# Patient Record
Sex: Female | Born: 1995 | Race: White | Hispanic: No | Marital: Single | Smoking: Never smoker
Health system: Southern US, Community
[De-identification: ages and names within clinical notes are randomized; demographics above are authoritative.]

---
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
## 2017-03-03 ENCOUNTER — Ambulatory Visit (INDEPENDENT_AMBULATORY_CARE_PROVIDER_SITE_OTHER): Payer: Federal, State, Local not specified - PPO | Admitting: Women's Health

## 2017-03-03 ENCOUNTER — Encounter: Payer: Self-pay | Admitting: Women's Health

## 2017-03-03 VITALS — BP 112/78 | Ht 66.0 in | Wt 136.0 lb

## 2017-03-03 DIAGNOSIS — Z113 Encounter for screening for infections with a predominantly sexual mode of transmission: Secondary | ICD-10-CM | POA: Diagnosis not present

## 2017-03-03 DIAGNOSIS — Z01419 Encounter for gynecological examination (general) (routine) without abnormal findings: Secondary | ICD-10-CM

## 2017-03-03 DIAGNOSIS — Z23 Encounter for immunization: Secondary | ICD-10-CM | POA: Diagnosis not present

## 2017-03-03 DIAGNOSIS — N760 Acute vaginitis: Secondary | ICD-10-CM | POA: Diagnosis not present

## 2017-03-03 DIAGNOSIS — N898 Other specified noninflammatory disorders of vagina: Secondary | ICD-10-CM | POA: Diagnosis not present

## 2017-03-03 DIAGNOSIS — B373 Candidiasis of vulva and vagina: Secondary | ICD-10-CM | POA: Diagnosis not present

## 2017-03-03 DIAGNOSIS — B9689 Other specified bacterial agents as the cause of diseases classified elsewhere: Secondary | ICD-10-CM

## 2017-03-03 DIAGNOSIS — Z30011 Encounter for initial prescription of contraceptive pills: Secondary | ICD-10-CM

## 2017-03-03 DIAGNOSIS — B3731 Acute candidiasis of vulva and vagina: Secondary | ICD-10-CM | POA: Insufficient documentation

## 2017-03-03 LAB — WET PREP FOR TRICH, YEAST, CLUE: Trich, Wet Prep: NONE SEEN

## 2017-03-03 MED ORDER — METRONIDAZOLE 0.75 % VA GEL: VAGINAL | 0 refills | Status: DC

## 2017-03-03 MED ORDER — FLUCONAZOLE 150 MG PO TABS: 150.0000 mg | ORAL_TABLET | Freq: Once | ORAL | 2 refills | Status: AC

## 2017-03-03 MED ORDER — DROSPIRENONE-ETHINYL ESTRADIOL 3-0.02 MG PO TABS: 1.0000 | ORAL_TABLET | Freq: Every day | ORAL | 4 refills | Status: DC

## 2017-03-03 NOTE — Patient Instructions (Signed)
Vaginal Yeast infection, Adult Vaginal yeast infection is a condition that causes soreness, swelling, and redness (inflammation) of the vagina. It also causes vaginal discharge. This is a common condition. Some women get this infection frequently. What are the causes? This condition is caused by a change in the normal balance of the yeast (candida) and bacteria that live in the vagina. This change causes an overgrowth of yeast, which causes the inflammation. What increases the risk? This condition is more likely to develop in:  Women who take antibiotic medicines.  Women who have diabetes.  Women who take birth control pills.  Women who are pregnant.  Women who douche often.  Women who have a weak defense (immune) system.  Women who have been taking steroid medicines for a long time.  Women who frequently wear tight clothing. What are the signs or symptoms? Symptoms of this condition include:  White, thick vaginal discharge.  Swelling, itching, redness, and irritation of the vagina. The lips of the vagina (vulva) may be affected as well.  Pain or a burning feeling while urinating.  Pain during sex. How is this diagnosed? This condition is diagnosed with a medical history and physical exam. This will include a pelvic exam. Your health care provider will examine a sample of your vaginal discharge under a microscope. Your health care provider may send this sample for testing to confirm the diagnosis. How is this treated? This condition is treated with medicine. Medicines may be over-the-counter or prescription. You may be told to use one or more of the following:  Medicine that is taken orally.  Medicine that is applied as a cream.  Medicine that is inserted directly into the vagina (suppository). Follow these instructions at home:  Take or apply over-the-counter and prescription medicines only as told by your health care provider.  Do not have sex until your health care  provider has approved. Tell your sex partner that you have a yeast infection. That person should go to his or her health care provider if he or she develops symptoms.  Do not wear tight clothes, such as pantyhose or tight pants.  Avoid using tampons until your health care provider approves.  Eat more yogurt. This may help to keep your yeast infection from returning.  Try taking a sitz bath to help with discomfort. This is a warm water bath that is taken while you are sitting down. The water should only come up to your hips and should cover your buttocks. Do this 3-4 times per day or as told by your health care provider.  Do not douche.  Wear breathable, cotton underwear.  If you have diabetes, keep your blood sugar levels under control. Contact a health care provider if:  You have a fever.  Your symptoms go away and then return.  Your symptoms do not get better with treatment.  Your symptoms get worse.  You have new symptoms.  You develop blisters in or around your vagina.  You have blood coming from your vagina and it is not your menstrual period.  You develop pain in your abdomen. This information is not intended to replace advice given to you by your health care provider. Make sure you discuss any questions you have with your health care provider. Document Released: 09/08/2005 Document Revised: 05/12/2016 Document Reviewed: 06/02/2015 Elsevier Interactive Patient Education  2017 Elsevier Inc. Health Maintenance, Female Adopting a healthy lifestyle and getting preventive care can go a long way to promote health and wellness. Talk with your   health care provider about what schedule of regular examinations is right for you. This is a good chance for you to check in with your provider about disease prevention and staying healthy. In between checkups, there are plenty of things you can do on your own. Experts have done a lot of research about which lifestyle changes and preventive  measures are most likely to keep you healthy. Ask your health care provider for more information. Weight and diet Eat a healthy diet  Be sure to include plenty of vegetables, fruits, low-fat dairy products, and lean protein.  Do not eat a lot of foods high in solid fats, added sugars, or salt.  Get regular exercise. This is one of the most important things you can do for your health.  Most adults should exercise for at least 150 minutes each week. The exercise should increase your heart rate and make you sweat (moderate-intensity exercise).  Most adults should also do strengthening exercises at least twice a week. This is in addition to the moderate-intensity exercise. Maintain a healthy weight  Body mass index (BMI) is a measurement that can be used to identify possible weight problems. It estimates body fat based on height and weight. Your health care provider can help determine your BMI and help you achieve or maintain a healthy weight.  For females 20 years of age and older:  A BMI below 18.5 is considered underweight.  A BMI of 18.5 to 24.9 is normal.  A BMI of 25 to 29.9 is considered overweight.  A BMI of 30 and above is considered obese. Watch levels of cholesterol and blood lipids  You should start having your blood tested for lipids and cholesterol at 20 years of age, then have this test every 5 years.  You may need to have your cholesterol levels checked more often if:  Your lipid or cholesterol levels are high.  You are older than 19 years of age.  You are at high risk for heart disease. Cancer screening Lung Cancer  Lung cancer screening is recommended for adults 55-80 years old who are at high risk for lung cancer because of a history of smoking.  A yearly low-dose CT scan of the lungs is recommended for people who:  Currently smoke.  Have quit within the past 15 years.  Have at least a 30-pack-year history of smoking. A pack year is smoking an average of  one pack of cigarettes a day for 1 year.  Yearly screening should continue until it has been 15 years since you quit.  Yearly screening should stop if you develop a health problem that would prevent you from having lung cancer treatment. Breast Cancer  Practice breast self-awareness. This means understanding how your breasts normally appear and feel.  It also means doing regular breast self-exams. Let your health care provider know about any changes, no matter how small.  If you are in your 20s or 30s, you should have a clinical breast exam (CBE) by a health care provider every 1-3 years as part of a regular health exam.  If you are 40 or older, have a CBE every year. Also consider having a breast X-ray (mammogram) every year.  If you have a family history of breast cancer, talk to your health care provider about genetic screening.  If you are at high risk for breast cancer, talk to your health care provider about having an MRI and a mammogram every year.  Breast cancer gene (BRCA) assessment is recommended for   women who have family members with BRCA-related cancers. BRCA-related cancers include:  Breast.  Ovarian.  Tubal.  Peritoneal cancers.  Results of the assessment will determine the need for genetic counseling and BRCA1 and BRCA2 testing. Cervical Cancer  Your health care provider may recommend that you be screened regularly for cancer of the pelvic organs (ovaries, uterus, and vagina). This screening involves a pelvic examination, including checking for microscopic changes to the surface of your cervix (Pap test). You may be encouraged to have this screening done every 3 years, beginning at age 21.  For women ages 30-65, health care providers may recommend pelvic exams and Pap testing every 3 years, or they may recommend the Pap and pelvic exam, combined with testing for human papilloma virus (HPV), every 5 years. Some types of HPV increase your risk of cervical cancer.  Testing for HPV may also be done on women of any age with unclear Pap test results.  Other health care providers may not recommend any screening for nonpregnant women who are considered low risk for pelvic cancer and who do not have symptoms. Ask your health care provider if a screening pelvic exam is right for you.  If you have had past treatment for cervical cancer or a condition that could lead to cancer, you need Pap tests and screening for cancer for at least 20 years after your treatment. If Pap tests have been discontinued, your risk factors (such as having a new sexual partner) need to be reassessed to determine if screening should resume. Some women have medical problems that increase the chance of getting cervical cancer. In these cases, your health care provider may recommend more frequent screening and Pap tests. Colorectal Cancer  This type of cancer can be detected and often prevented.  Routine colorectal cancer screening usually begins at 19 years of age and continues through 19 years of age.  Your health care provider may recommend screening at an earlier age if you have risk factors for colon cancer.  Your health care provider may also recommend using home test kits to check for hidden blood in the stool.  A small camera at the end of a tube can be used to examine your colon directly (sigmoidoscopy or colonoscopy). This is done to check for the earliest forms of colorectal cancer.  Routine screening usually begins at age 50.  Direct examination of the colon should be repeated every 5-10 years through 19 years of age. However, you may need to be screened more often if early forms of precancerous polyps or small growths are found. Skin Cancer  Check your skin from head to toe regularly.  Tell your health care provider about any new moles or changes in moles, especially if there is a change in a mole's shape or color.  Also tell your health care provider if you have a mole  that is larger than the size of a pencil eraser.  Always use sunscreen. Apply sunscreen liberally and repeatedly throughout the day.  Protect yourself by wearing long sleeves, pants, a wide-brimmed hat, and sunglasses whenever you are outside. Heart disease, diabetes, and high blood pressure  High blood pressure causes heart disease and increases the risk of stroke. High blood pressure is more likely to develop in:  People who have blood pressure in the high end of the normal range (130-139/85-89 mm Hg).  People who are overweight or obese.  People who are African American.  If you are 18-39 years of age, have your blood   pressure checked every 3-5 years. If you are 40 years of age or older, have your blood pressure checked every year. You should have your blood pressure measured twice-once when you are at a hospital or clinic, and once when you are not at a hospital or clinic. Record the average of the two measurements. To check your blood pressure when you are not at a hospital or clinic, you can use:  An automated blood pressure machine at a pharmacy.  A home blood pressure monitor.  If you are between 55 years and 79 years old, ask your health care provider if you should take aspirin to prevent strokes.  Have regular diabetes screenings. This involves taking a blood sample to check your fasting blood sugar level.  If you are at a normal weight and have a low risk for diabetes, have this test once every three years after 19 years of age.  If you are overweight and have a high risk for diabetes, consider being tested at a younger age or more often. Preventing infection Hepatitis B  If you have a higher risk for hepatitis B, you should be screened for this virus. You are considered at high risk for hepatitis B if:  You were born in a country where hepatitis B is common. Ask your health care provider which countries are considered high risk.  Your parents were born in a high-risk  country, and you have not been immunized against hepatitis B (hepatitis B vaccine).  You have HIV or AIDS.  You use needles to inject street drugs.  You live with someone who has hepatitis B.  You have had sex with someone who has hepatitis B.  You get hemodialysis treatment.  You take certain medicines for conditions, including cancer, organ transplantation, and autoimmune conditions. Hepatitis C  Blood testing is recommended for:  Everyone born from 1945 through 1965.  Anyone with known risk factors for hepatitis C. Sexually transmitted infections (STIs)  You should be screened for sexually transmitted infections (STIs) including gonorrhea and chlamydia if:  You are sexually active and are younger than 19 years of age.  You are older than 19 years of age and your health care provider tells you that you are at risk for this type of infection.  Your sexual activity has changed since you were last screened and you are at an increased risk for chlamydia or gonorrhea. Ask your health care provider if you are at risk.  If you do not have HIV, but are at risk, it may be recommended that you take a prescription medicine daily to prevent HIV infection. This is called pre-exposure prophylaxis (PrEP). You are considered at risk if:  You are sexually active and do not regularly use condoms or know the HIV status of your partner(s).  You take drugs by injection.  You are sexually active with a partner who has HIV. Talk with your health care provider about whether you are at high risk of being infected with HIV. If you choose to begin PrEP, you should first be tested for HIV. You should then be tested every 3 months for as long as you are taking PrEP. Pregnancy  If you are premenopausal and you may become pregnant, ask your health care provider about preconception counseling.  If you may become pregnant, take 400 to 800 micrograms (mcg) of folic acid every day.  If you want to prevent  pregnancy, talk to your health care provider about birth control (contraception). Osteoporosis and menopause    Osteoporosis is a disease in which the bones lose minerals and strength with aging. This can result in serious bone fractures. Your risk for osteoporosis can be identified using a bone density scan.  If you are 65 years of age or older, or if you are at risk for osteoporosis and fractures, ask your health care provider if you should be screened.  Ask your health care provider whether you should take a calcium or vitamin D supplement to lower your risk for osteoporosis.  Menopause may have certain physical symptoms and risks.  Hormone replacement therapy may reduce some of these symptoms and risks. Talk to your health care provider about whether hormone replacement therapy is right for you. Follow these instructions at home:  Schedule regular health, dental, and eye exams.  Stay current with your immunizations.  Do not use any tobacco products including cigarettes, chewing tobacco, or electronic cigarettes.  If you are pregnant, do not drink alcohol.  If you are breastfeeding, limit how much and how often you drink alcohol.  Limit alcohol intake to no more than 1 drink per day for nonpregnant women. One drink equals 12 ounces of beer, 5 ounces of wine, or 1 ounces of hard liquor.  Do not use street drugs.  Do not share needles.  Ask your health care provider for help if you need support or information about quitting drugs.  Tell your health care provider if you often feel depressed.  Tell your health care provider if you have ever been abused or do not feel safe at home. This information is not intended to replace advice given to you by your health care provider. Make sure you discuss any questions you have with your health care provider. Document Released: 06/14/2011 Document Revised: 05/06/2016 Document Reviewed: 09/02/2015 Elsevier Interactive Patient Education  2017  Elsevier Inc.  

## 2017-03-03 NOTE — Progress Notes (Signed)
Kylie Smith 1995/12/22 [id number]    History:    Presents for new patient annual exam. Monthly cycle. Has not received gardasil. One partner for 6 months. Complaint of vaginal irritation with itching.  Past medical history, past surgical history, family history and social history were all reviewed and documented in the EPIC chart. Freshman at [employer]. [employer] team academic scholarship. Parents healthy  ROS:  A ROS was performed and pertinent positives and negatives are included.  Exam:  Vitals:   03/03/17 1430  BP: 112/78  Weight: 136 lb (61.7 kg)  Height: 5\' 6"  (1.676 m)   Body mass index is 21.95 kg/m.   General appearance:  Normal Thyroid:  Symmetrical, normal in size, without palpable masses or nodularity. Respiratory  Auscultation:  Clear without wheezing or rhonchi Cardiovascular  Auscultation:  Regular rate, without rubs, murmurs or gallops  Edema/varicosities:  Not grossly evident Abdominal  Soft,nontender, without masses, guarding or rebound.  Liver/spleen:  No organomegaly noted  Hernia:  None appreciated  Skin  Inspection:  Grossly normal   Breasts: Examined lying and sitting. Bilateral piercing    Right: Without masses, retractions, discharge or axillary adenopathy.     Left: Without masses, retractions, discharge or axillary adenopathy. Gentitourinary   Inguinal/mons:  Normal without inguinal adenopathy  External genitalia:  Normal  BUS/Urethra/Skene's glands:  Normal  Vagina:  Normal  Cervix:  Normal  Uterus:   normal in size, shape and contour.  Midline and mobile  Adnexa/parametria:     Rt: Without masses or tenderness.   Lt: Without masses or tenderness.  Anus and perineum: Normal    Assessment/Plan:  21 y.o. S WF G0  for annual exam complaint of vaginal irritation and complaint of size of labias.  Monthly cycle Conception management STD screen Bacteria vaginosis and yeast vaginitis  Plan: Diflucan 150 by mouth times one dose with  refill. MetroGel vaginal cream 1 applicator at bedtime 5, alcohol precautions reviewed. Contraception options reviewed, Yaz prescription, proper use, slight risk for blood clots and strokes reviewed. Start up instructions and importance of condoms first month and for infection control reviewed. SBE's, exercise, calcium rich diet, MVI daily encouraged. Campus safety reviewed. CBC, GC/Chlamydia, HIV, hep B, C, RPR. First gardasil given instructed to return to office in 2 and 6 months to complete series. Reviewed normality of labias.  Harrington ChallengerYOUNG,NANCY J Schwab Rehabilitation CenterWHNP, 3:50 PM 03/03/2017

## 2017-03-04 LAB — GC/CHLAMYDIA PROBE AMP
CT Probe RNA: DETECTED — AB
GC Probe RNA: NOT DETECTED

## 2017-03-07 ENCOUNTER — Other Ambulatory Visit: Payer: Self-pay | Admitting: Women's Health

## 2017-03-07 MED ORDER — AZITHROMYCIN 500 MG PO TABS: 1000.0000 mg | ORAL_TABLET | Freq: Once | ORAL | 0 refills | Status: AC

## 2017-03-28 ENCOUNTER — Ambulatory Visit (INDEPENDENT_AMBULATORY_CARE_PROVIDER_SITE_OTHER): Payer: Federal, State, Local not specified - PPO | Admitting: Women's Health

## 2017-03-28 ENCOUNTER — Encounter: Payer: Self-pay | Admitting: Women's Health

## 2017-03-28 VITALS — BP 106/68

## 2017-03-28 DIAGNOSIS — A749 Chlamydial infection, unspecified: Secondary | ICD-10-CM | POA: Insufficient documentation

## 2017-03-28 DIAGNOSIS — F909 Attention-deficit hyperactivity disorder, unspecified type: Secondary | ICD-10-CM | POA: Diagnosis not present

## 2017-03-28 DIAGNOSIS — Z113 Encounter for screening for infections with a predominantly sexual mode of transmission: Secondary | ICD-10-CM | POA: Diagnosis not present

## 2017-03-28 MED ORDER — LISDEXAMFETAMINE DIMESYLATE 30 MG PO CAPS: 30.0000 mg | ORAL_CAPSULE | Freq: Every day | ORAL | 0 refills | Status: DC

## 2017-03-28 NOTE — Patient Instructions (Signed)
Chlamydia, Female Chlamydia is an STD (sexually transmitted disease). It is a bacterial infection that spreads (is contagious) through sexual contact. Chlamydia can occur in different areas of the body, including:  The tube that moves urine from the bladder out of the body (urethra).  The lower part of the uterus (cervix).  The throat.  The rectum.  This condition is not difficult to treat. However, if left untreated, chlamydia can lead to more serious health problems, including pelvic inflammatory disorder (PID). PID can increase your risk of not being able to have children (sterility). What are the causes? Chlamydia is caused by the bacteria Chlamydia trachomatis. It is passed from an infected partner during sexual activity. Chlamydia can spread through contact with the genitals, mouth, or rectum. What are the signs or symptoms? In some cases, there may not be any symptoms for this condition (asymptomatic), especially early in the infection. If symptoms develop, they may include:  Burning with urination.  Frequent urination.  Vaginal discharge.  Redness, soreness, and swelling (inflammation) of the rectum.  Bleeding or discharge from the rectum.  Abdominal pain.  Pain during sexual intercourse.  Bleeding between menstrual periods.  Itching, burning, or redness in the eyes, or discharge from the eyes.  How is this diagnosed? This condition may be diagnosed with:  Urine tests.  Swab tests. Depending on your symptoms, your health care provider may use a cotton swab to collect discharge from your vagina or rectum to test for the bacteria.  A pelvic exam.  How is this treated? This condition is treated with antibiotic medicines. If you are pregnant, certain types of antibiotics will need to be avoided. Follow these instructions at home: Medicines  Take over-the-counter and prescription medicines only as told by your health care provider.  Take your antibiotic medicine  as told by your health care provider. Do not stop taking the antibiotic even if you start to feel better. Sexual activity  Tell sexual partners about your infection. This includes any oral, anal, or vaginal sex partners you have had within 60 days of when your symptoms started. Sexual partners should also be treated, even if they have no signs of the disease.  Do not have sex until you and your sexual partners have completed treatment and your health care provider says it is okay. If your health care provider prescribed you a single dose treatment, wait 7 days after taking the treatment before having sex. General instructions  It is your responsibility to get your test results. Ask your health care provider, or the department performing the test, when your results will be ready.  Get plenty of rest.  Eat a healthy, well-balanced diet.  Drink enough fluids to keep your urine clear or pale yellow.  Keep all follow-up visits as told by your health care provider. This is important. You may need to be tested for infection again 3 months after treatment. How is this prevented? The only sure way to prevent chlamydia is to avoid having sex. However, you can lower your risk by:  Using latex condoms correctly every time you have sex.  Not having multiple sexual partners.  Asking if your sexual partner has been tested for STIs and had negative results.  Contact a health care provider if:  You develop new symptoms or your symptoms do not get better after completing treatment.  You have a fever or chills.  You have pain during sexual intercourse. Get help right away if:  Your pain gets worse and does   not get better with medicine.  You develop flu-like symptoms, such as night sweats, sore throat, or muscle aches.  You experience nausea or vomiting.  You have difficulty swallowing.  You have bleeding between periods or after sex.  You have irregular menstrual periods.  You have  abdominal or lower back pain that does not get better with medicine.  You feel weak or dizzy, or you faint.  You are pregnant and you develop symptoms of chlamydia. Summary  Chlamydia is an STD (sexually transmitted disease). It is a bacterial infection that spreads (is contagious) through sexual contact.  This condition is not difficult to treat, however. If left untreated, chlamydia can lead to more serious health problems, including pelvic inflammatory disease (PID).  In some cases, there may not be any symptoms for this condition (asymptomatic).  This condition is treated with antibiotic medicines.  Using latex condoms correctly every time you have sex can help prevent chlamydia. This information is not intended to replace advice given to you by your health care provider. Make sure you discuss any questions you have with your health care provider. Document Released: 09/08/2005 Document Revised: 11/15/2016 Document Reviewed: 11/15/2016 Elsevier Interactive Patient Education  2017 Elsevier Inc.  

## 2017-03-28 NOTE — Progress Notes (Signed)
Presents for test of cure chlamydia, had negative HIV, hepatitis and RPR. First partner. Partner reported he was negative at testing but was treated, has abstained. Contraceptives with Yaz without complaint. 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  Only uses during the school year. Normally gets her prescription at home in [place]. Denies abdominal pain, vaginal discharge, urinary symptoms or fever. Received first Gardasil 02/27/2017.  Exam: Appears well. External genitalia within normal limits, speculum exam no visible discharge or erythema, GC/Chlamydia culture taken. Bimanual no CMT or adnexal tenderness.  Test of cure chlamydia  Plan: Reviewed importance of condoms until permanent partner. Return to office next month for second Gardasil, and 4 months to complete series. Questions answered. One month supply of Vyvanse given, reviewed and aware we cannot renew on a regular basis.

## 2017-03-29 LAB — GC/CHLAMYDIA PROBE AMP
CT Probe RNA: NOT DETECTED
GC Probe RNA: NOT DETECTED

## 2017-05-03 ENCOUNTER — Ambulatory Visit: Payer: Federal, State, Local not specified - PPO

## 2017-09-13 ENCOUNTER — Telehealth: Payer: Self-pay

## 2017-09-13 NOTE — Telephone Encounter (Signed)
Telephone call, to discuss labioplasty. 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 States her labia is irritated daily with clothing rubbing, spandex rubbing causing her discomfort and insecurity. Did review this is an elective procedure insurance will not cover. Will schedule consult with Dr. Audie Box to discuss. Switched to appointments.

## 2017-09-13 NOTE — Telephone Encounter (Signed)
Patient called in my voice mail saying she had some questions about "labia surgery".  Your note from 03/03/17 noted "Reviewed normality of labias."    I think maybe you should be the one to call her.

## 2017-09-16 ENCOUNTER — Institutional Professional Consult (permissible substitution): Payer: Federal, State, Local not specified - PPO | Admitting: Gynecology

## 2017-09-19 ENCOUNTER — Ambulatory Visit: Payer: Federal, State, Local not specified - PPO | Admitting: Women's Health

## 2017-09-22 ENCOUNTER — Institutional Professional Consult (permissible substitution): Payer: Federal, State, Local not specified - PPO | Admitting: Gynecology

## 2017-10-31 ENCOUNTER — Ambulatory Visit: Payer: Federal, State, Local not specified - PPO | Admitting: Women's Health

## 2017-10-31 ENCOUNTER — Encounter: Payer: Self-pay | Admitting: Women's Health

## 2017-10-31 VITALS — BP 124/78 | Ht 66.0 in | Wt 133.0 lb

## 2017-10-31 DIAGNOSIS — B3731 Acute candidiasis of vulva and vagina: Secondary | ICD-10-CM

## 2017-10-31 DIAGNOSIS — Z113 Encounter for screening for infections with a predominantly sexual mode of transmission: Secondary | ICD-10-CM

## 2017-10-31 DIAGNOSIS — N898 Other specified noninflammatory disorders of vagina: Secondary | ICD-10-CM

## 2017-10-31 DIAGNOSIS — B373 Candidiasis of vulva and vagina: Secondary | ICD-10-CM

## 2017-10-31 LAB — WET PREP FOR TRICH, YEAST, CLUE

## 2017-10-31 MED ORDER — FLUCONAZOLE 150 MG PO TABS: 150.0000 mg | ORAL_TABLET | Freq: Once | ORAL | 1 refills | Status: AC

## 2017-10-31 NOTE — Progress Notes (Signed)
21 year old SWF G0 presents with complaint  Of vaginal iritation with discharge. Denies urinary symptoms abdominal pain or fever. New partner.  Positive chlamydia 02/2017 with a negative test of cure. Monthly cycle on Yaz OCs. Had been contemplating labioplasty,  Reports labia is causing irritation with clothing. Has decided to wait due to busy schedule at school.  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   Exam: Appears well.  External genitalia slightly enlarged labias, reviewed within normal limits. Minimal erythema, speculum exam copious white curdy discharge, wet prep positive for yeast. Bimanual no CMT or adnexal tenderness. GC/Chlamydia culture taken.  Yeast vaginitis STD screen  Plan: Diflucan 150 by mouth times one dose with refill. Yeast prevention discussed.GC/Chlamydia culture pending, HIV, hep B, C, RPR.Other contraception options reviewed per request, declines will think about it for now.

## 2017-10-31 NOTE — Patient Instructions (Signed)

## 2017-11-02 LAB — C. TRACHOMATIS/N. GONORRHOEAE RNA
C. TRACHOMATIS RNA, TMA: NOT DETECTED
N. gonorrhoeae RNA, TMA: NOT DETECTED

## 2017-11-11 ENCOUNTER — Telehealth: Payer: Self-pay | Admitting: *Deleted

## 2017-11-11 MED ORDER — ACYCLOVIR 5 % EX OINT: 1.0000 "application " | TOPICAL_OINTMENT | Freq: Every day | CUTANEOUS | 0 refills | Status: DC

## 2017-11-11 MED ORDER — VALACYCLOVIR HCL 1 G PO TABS: ORAL_TABLET | ORAL | 2 refills | Status: DC

## 2017-11-11 NOTE — Telephone Encounter (Signed)
You are back up md) patient has HSV 1 asked if Rx for ointment and pill could be prescribed stated that typically helps pain relief faster. Please advise

## 2017-11-11 NOTE — Telephone Encounter (Signed)
Rx sent. Pt aware.  

## 2017-11-11 NOTE — Telephone Encounter (Signed)
Okay for Valtrex 1000 mg tablets, 2 now and repeat in 12 hours #4 total refill x2 provided.  Also okay for 1 tube acyclovir ointment apply 5 times daily

## 2018-01-30 ENCOUNTER — Other Ambulatory Visit: Payer: Self-pay | Admitting: Women's Health

## 2018-01-30 DIAGNOSIS — Z30011 Encounter for initial prescription of contraceptive pills: Secondary | ICD-10-CM

## 2018-02-10 ENCOUNTER — Ambulatory Visit: Payer: Federal, State, Local not specified - PPO | Admitting: Allergy

## 2018-03-20 ENCOUNTER — Encounter: Payer: Self-pay | Admitting: Women's Health

## 2018-03-20 ENCOUNTER — Ambulatory Visit (INDEPENDENT_AMBULATORY_CARE_PROVIDER_SITE_OTHER): Payer: Federal, State, Local not specified - PPO | Admitting: Women's Health

## 2018-03-20 VITALS — BP 118/78

## 2018-03-20 DIAGNOSIS — N898 Other specified noninflammatory disorders of vagina: Secondary | ICD-10-CM | POA: Diagnosis not present

## 2018-03-20 DIAGNOSIS — Z30011 Encounter for initial prescription of contraceptive pills: Secondary | ICD-10-CM | POA: Diagnosis not present

## 2018-03-20 LAB — WET PREP FOR TRICH, YEAST, CLUE

## 2018-03-20 MED ORDER — FLUCONAZOLE 150 MG PO TABS: ORAL_TABLET | ORAL | 1 refills | Status: DC

## 2018-03-20 MED ORDER — DROSPIRENONE-ETHINYL ESTRADIOL 3-0.02 MG PO TABS: 1.0000 | ORAL_TABLET | Freq: Every day | ORAL | 0 refills | Status: DC

## 2018-03-20 NOTE — Patient Instructions (Addendum)
Human Papillomavirus Quadrivalent Vaccine suspension for injection What is this medicine? HUMAN PAPILLOMAVIRUS VACCINE (HYOO muhn pap uh LOH muh vahy ruhs vak SEEN) is a vaccine. It is used to prevent infections of four types of the human papillomavirus. In women, the vaccine may lower your risk of getting cervical, vaginal, vulvar, or anal cancer and genital warts. In men, the vaccine may lower your risk of getting genital warts and anal cancer. You cannot get these diseases from the vaccine. This vaccine does not treat these diseases. This medicine may be used for other purposes; ask your health care provider or pharmacist if you have questions. COMMON BRAND NAME(S): Gardasil What should I tell my health care provider before I take this medicine? They need to know if you have any of these conditions: -fever or infection -hemophilia -HIV infection or AIDS -immune system problems -low platelet count -an unusual reaction to Human Papillomavirus Vaccine, yeast, other medicines, foods, dyes, or preservatives -pregnant or trying to get pregnant -breast-feeding How should I use this medicine? This vaccine is for injection in a muscle on your upper arm or thigh. It is given by a health care professional. Kylie Smith will be observed for 15 minutes after each dose. Sometimes, fainting happens after the vaccine is given. You may be asked to sit or lie down during the 15 minutes. Three doses are given. The second dose is given 2 months after the first dose. The last dose is given 4 months after the second dose. A copy of a Vaccine Information Statement will be given before each vaccination. Read this sheet carefully each time. The sheet may change frequently. Talk to your pediatrician regarding the use of this medicine in children. While this drug may be prescribed for children as Kylie Smith as 20 years of age for selected conditions, precautions do apply. Overdosage: If you think you have taken too much of this  medicine contact a poison control center or emergency room at once. NOTE: This medicine is only for you. Do not share this medicine with others. What if I miss a dose? All 3 doses of the vaccine should be given within 6 months. Remember to keep appointments for follow-up doses. Your health care provider will tell you when to return for the next vaccine. Ask your health care professional for advice if you are unable to keep an appointment or miss a scheduled dose. What may interact with this medicine? -other vaccines This list may not describe all possible interactions. Give your health care provider a list of all the medicines, herbs, non-prescription drugs, or dietary supplements you use. Also tell them if you smoke, drink alcohol, or use illegal drugs. Some items may interact with your medicine. What should I watch for while using this medicine? This vaccine may not fully protect everyone. Continue to have regular pelvic exams and cervical or anal cancer screenings as directed by your doctor. The Human Papillomavirus is a sexually transmitted disease. It can be passed by any kind of sexual activity that involves genital contact. The vaccine works best when given before you have any contact with the virus. Many people who have the virus do not have any signs or symptoms. Tell your doctor or health care professional if you have any reaction or unusual symptom after getting the vaccine. What side effects may I notice from receiving this medicine? Side effects that you should report to your doctor or health care professional as soon as possible: -allergic reactions like skin rash, itching or hives, swelling  of the face, lips, or tongue -breathing problems -feeling faint or lightheaded, falls Side effects that usually do not require medical attention (report to your doctor or health care professional if they continue or are bothersome): -cough -dizziness -fever -headache -nausea -redness, warmth,  swelling, pain, or itching at site where injected This list may not describe all possible side effects. Call your doctor for medical advice about side effects. You may report side effects to FDA at 1-800-FDA-1088. Where should I keep my medicine? This drug is given in a hospital or clinic and will not be stored at home. NOTE: This sheet is a summary. It may not cover all possible information. If you have questions about this medicine, talk to your doctor, pharmacist, or health care provider.  2018 Elsevier/Gold Standard (2014-01-21 13:14:33)  Vaginal Yeast infection, Adult Vaginal yeast infection is a condition that causes soreness, swelling, and redness (inflammation) of the vagina. It also causes vaginal discharge. This is a common condition. Some women get this infection frequently. What are the causes? This condition is caused by a change in the normal balance of the yeast (candida) and bacteria that live in the vagina. This change causes an overgrowth of yeast, which causes the inflammation. What increases the risk? This condition is more likely to develop in:  Women who take antibiotic medicines.  Women who have diabetes.  Women who take birth control pills.  Women who are pregnant.  Women who douche often.  Women who have a weak defense (immune) system.  Women who have been taking steroid medicines for a long time.  Women who frequently wear tight clothing.  What are the signs or symptoms? Symptoms of this condition include:  White, thick vaginal discharge.  Swelling, itching, redness, and irritation of the vagina. The lips of the vagina (vulva) may be affected as well.  Pain or a burning feeling while urinating.  Pain during sex.  How is this diagnosed? This condition is diagnosed with a medical history and physical exam. This will include a pelvic exam. Your health care provider will examine a sample of your vaginal discharge under a microscope. Your health care  provider may send this sample for testing to confirm the diagnosis. How is this treated? This condition is treated with medicine. Medicines may be over-the-counter or prescription. You may be told to use one or more of the following:  Medicine that is taken orally.  Medicine that is applied as a cream.  Medicine that is inserted directly into the vagina (suppository).  Follow these instructions at home:  Take or apply over-the-counter and prescription medicines only as told by your health care provider.  Do not have sex until your health care provider has approved. Tell your sex partner that you have a yeast infection. That person should go to his or her health care provider if he or she develops symptoms.  Do not wear tight clothes, such as pantyhose or tight pants.  Avoid using tampons until your health care provider approves.  Eat more yogurt. This may help to keep your yeast infection from returning.  Try taking a sitz bath to help with discomfort. This is a warm water bath that is taken while you are sitting down. The water should only come up to your hips and should cover your buttocks. Do this 3-4 times per day or as told by your health care provider.  Do not douche.  Wear breathable, cotton underwear.  If you have diabetes, keep your blood sugar levels under  control. Contact a health care provider if:  You have a fever.  Your symptoms go away and then return.  Your symptoms do not get better with treatment.  Your symptoms get worse.  You have new symptoms.  You develop blisters in or around your vagina.  You have blood coming from your vagina and it is not your menstrual period.  You develop pain in your abdomen. This information is not intended to replace advice given to you by your health care provider. Make sure you discuss any questions you have with your health care provider. Document Released: 09/08/2005 Document Revised: 05/12/2016 Document Reviewed:  06/02/2015 Elsevier Interactive Patient Education  2018 [employer].

## 2018-03-20 NOTE — Progress Notes (Signed)
22 year old SWF presents with complaint of vaginal discharge with irritation, itching for the past week.  History of BV and yeast in the past.  New partner.  Monthly cycle on Yaz.  Received first Gardasil unsure if she wants to continue, mother is opposed.  Reports mild burning with urination at initiation only.  Denies abdominal pain, pain addenda stream of urination or fever.  Also desires labioplasty, reports vaginal irritation and thinks reducing size of labias will help with preventing irritation.  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  Exam: Appears well.  No CVAT.  Abdomen soft, nontender, external genitalia erythemic at introitus only, labia is mildly hypertrophied, speculum exam moderate amount of a curdy white discharge, vaginal walls erythematous, wet prep positive for yeast with hyphae.  GC/Chlamydia culture taken.  Bimanual no CMT or adnexal tenderness.  Tenderness only in vaginal area.  Yeast vaginitis Desire for labioplasty  Plan: Diflucan 150 p.o. today repeat in 3 days.  Yeast prevention discussed, instructed to call if continued problems.  Reviewed labia is only slightly enlarged, most likely it would be considered an elective surgery, reviewed all surgeries have risks of infection, anesthesia risks.  Requested to see costs.  Annual exam is due instructed to schedule, 4511-month refill of Yaz given but instructed to schedule annual.  GC/Chlamydia culture pending.  Will check HIV, hepatitis and RPR and annual exam.  Gardasil information given instructed to discuss with mother, reviewed cancer prevention vaccine.

## 2018-03-21 LAB — C. TRACHOMATIS/N. GONORRHOEAE RNA
C. TRACHOMATIS RNA, TMA: NOT DETECTED
N. gonorrhoeae RNA, TMA: NOT DETECTED

## 2018-03-23 ENCOUNTER — Telehealth: Payer: Self-pay

## 2018-03-23 NOTE — Telephone Encounter (Signed)
NY had asked me to call patient about cost for labioplasty. I spoke with patient and relayed that it would likely be considered cosmetic surgery since not medically indicated.  I advised her [facility]GA cost and that would be due before surgery. [facility] and stressed [facility] as a rough [facility].  She will talk with her mom. I told her the next step will be to make appt with a doctor to see if they are even comfortable with the idea of performing this surgery.

## 2018-06-09 ENCOUNTER — Telehealth: Payer: Self-pay | Admitting: *Deleted

## 2018-06-09 NOTE — Telephone Encounter (Signed)
Patient called and left message in voicemail requesting Plan B pill. I called patient back and told her she can buy this at pharmacy, no Rx needed.

## 2018-07-18 ENCOUNTER — Ambulatory Visit: Payer: Federal, State, Local not specified - PPO | Admitting: Gynecology

## 2018-07-18 ENCOUNTER — Encounter: Payer: Self-pay | Admitting: Gynecology

## 2018-07-18 VITALS — BP 118/74

## 2018-07-18 DIAGNOSIS — B9689 Other specified bacterial agents as the cause of diseases classified elsewhere: Secondary | ICD-10-CM | POA: Diagnosis not present

## 2018-07-18 DIAGNOSIS — Z113 Encounter for screening for infections with a predominantly sexual mode of transmission: Secondary | ICD-10-CM | POA: Diagnosis not present

## 2018-07-18 DIAGNOSIS — N760 Acute vaginitis: Secondary | ICD-10-CM | POA: Diagnosis not present

## 2018-07-18 LAB — WET PREP FOR TRICH, YEAST, CLUE

## 2018-07-18 MED ORDER — METRONIDAZOLE 500 MG PO TABS: 500.0000 mg | ORAL_TABLET | Freq: Two times a day (BID) | ORAL | 0 refills | Status: DC

## 2018-07-18 NOTE — Addendum Note (Signed)
Addended by: Dayna BarkerGARDNER, Shamon Cothran K on: 07/18/2018 09:46 AM   Modules accepted: Orders

## 2018-07-18 NOTE — Patient Instructions (Signed)
Take the Flagyl medication twice daily for 7 days.  Avoid alcohol while taking.  Follow-up if the symptoms persist or recur.

## 2018-07-18 NOTE — Progress Notes (Signed)
    Kylie Smith March 08, 1996 [id number]        22 y.o.  G0P0000 presents with a one-week history of vaginal discharge with odor.  Mild irritation.  No itching.  History of both yeast and bacterial vaginosis in the past.  No urinary symptoms such as frequency dysuria urgency low back pain fever or chills.  Also requests GC/chlamydia screen.  No exposure but wants to be screened.  Past medical history,surgical history, problem list, medications, allergies, family history and social history were all reviewed and documented in the EPIC chart.  Directed ROS with pertinent positives and negatives documented in the history of present illness/assessment and plan.  Exam: Kennon PortelaKim Gardner assistant Vitals:   07/18/18 0836  BP: 118/74   General appearance:  Normal Abdomen soft nontender without masses guarding rebound. Pelvic external BUS vagina with white discharge.  Cervix normal.  Uterus normal size midline mobile nontender.  Adnexa without masses or tenderness.  Assessment/Plan:  22 y.o. G0P0000 with vaginal discharge and wet prep consistent with bacterial vaginosis.  Options for management reviewed and she elects for Flagyl 500 mg twice daily x7 days, alcohol avoidance reviewed.  We discussed recurrent issues with vaginal infections both yeast and bacterial vaginosis.  Need for condoms regardless of other forms of contraception.  Possible prophylactic regimens also reviewed.  As she does not consistently have one or the other type infection repetitively it makes it a little harder to prophylax for both.  At this point were going to wait and see how she does.  She will follow-up if her symptoms persist or she has a recurrence.    Dara Lordsimothy P Darshay Deupree MD, 8:55 AM 07/18/2018

## 2018-07-19 LAB — C. TRACHOMATIS/N. GONORRHOEAE RNA
C. trachomatis RNA, TMA: NOT DETECTED
N. gonorrhoeae RNA, TMA: NOT DETECTED

## 2018-11-05 ENCOUNTER — Other Ambulatory Visit: Payer: Self-pay | Admitting: Women's Health

## 2018-11-05 DIAGNOSIS — Z30011 Encounter for initial prescription of contraceptive pills: Secondary | ICD-10-CM

## 2019-02-19 ENCOUNTER — Encounter (HOSPITAL_COMMUNITY): Payer: Self-pay

## 2019-02-19 ENCOUNTER — Emergency Department (HOSPITAL_COMMUNITY): Payer: Federal, State, Local not specified - PPO

## 2019-02-19 ENCOUNTER — Emergency Department (HOSPITAL_COMMUNITY)
Admission: EM | Admit: 2019-02-19 | Discharge: 2019-02-20 | Disposition: A | Discharge status: Home / Self Care | Payer: Federal, State, Local not specified - PPO | Attending: Emergency Medicine | Admitting: Emergency Medicine

## 2019-02-19 ENCOUNTER — Other Ambulatory Visit: Payer: Self-pay

## 2019-02-19 DIAGNOSIS — R112 Nausea with vomiting, unspecified: Secondary | ICD-10-CM | POA: Diagnosis not present

## 2019-02-19 DIAGNOSIS — B9789 Other viral agents as the cause of diseases classified elsewhere: Secondary | ICD-10-CM

## 2019-02-19 DIAGNOSIS — J069 Acute upper respiratory infection, unspecified: Secondary | ICD-10-CM | POA: Insufficient documentation

## 2019-02-19 DIAGNOSIS — Z793 Long term (current) use of hormonal contraceptives: Secondary | ICD-10-CM | POA: Insufficient documentation

## 2019-02-19 DIAGNOSIS — J111 Influenza due to unidentified influenza virus with other respiratory manifestations: Secondary | ICD-10-CM | POA: Diagnosis not present

## 2019-02-19 DIAGNOSIS — R05 Cough: Secondary | ICD-10-CM | POA: Diagnosis not present

## 2019-02-19 DIAGNOSIS — R197 Diarrhea, unspecified: Secondary | ICD-10-CM | POA: Diagnosis not present

## 2019-02-19 DIAGNOSIS — R300 Dysuria: Secondary | ICD-10-CM | POA: Insufficient documentation

## 2019-02-19 DIAGNOSIS — J101 Influenza due to other identified influenza virus with other respiratory manifestations: Secondary | ICD-10-CM

## 2019-02-19 DIAGNOSIS — R509 Fever, unspecified: Secondary | ICD-10-CM | POA: Diagnosis present

## 2019-02-19 LAB — BASIC METABOLIC PANEL
Anion gap: 7 (ref 5–15)
BUN: 15 mg/dL (ref 6–20)
CHLORIDE: 103 mmol/L (ref 98–111)
CO2: 24 mmol/L (ref 22–32)
Calcium: 8.7 mg/dL — ABNORMAL LOW (ref 8.9–10.3)
Creatinine, Ser: 0.83 mg/dL (ref 0.44–1.00)
GFR calc Af Amer: >60 mL/min (ref >60)
GFR calc non Af Amer: >60 mL/min (ref >60)
Glucose, Bld: 99 mg/dL (ref 70–99)
Potassium: 3.4 mmol/L — ABNORMAL LOW (ref 3.5–5.1)
Sodium: 134 mmol/L — ABNORMAL LOW (ref 135–145)

## 2019-02-19 LAB — CBC WITH DIFFERENTIAL/PLATELET
Abs Immature Granulocytes: 0.01 10*3/uL (ref 0.00–0.07)
Basophils Absolute: 0 10*3/uL (ref 0.0–0.1)
Basophils Relative: 0 %
Eosinophils Absolute: 0 10*3/uL (ref 0.0–0.5)
Eosinophils Relative: 0 %
HCT: 37.8 % (ref 36.0–46.0)
Hemoglobin: 11.6 g/dL — ABNORMAL LOW (ref 12.0–15.0)
Immature Granulocytes: 0 %
LYMPHS ABS: 0.5 10*3/uL — AB (ref 0.7–4.0)
Lymphocytes Relative: 15 %
MCH: 26.4 pg (ref 26.0–34.0)
MCHC: 30.7 g/dL (ref 30.0–36.0)
MCV: 86.1 fL (ref 80.0–100.0)
MONOS PCT: 12 %
Monocytes Absolute: 0.4 10*3/uL (ref 0.1–1.0)
Neutro Abs: 2.7 10*3/uL (ref 1.7–7.7)
Neutrophils Relative %: 73 %
Platelets: 203 10*3/uL (ref 150–400)
RBC: 4.39 MIL/uL (ref 3.87–5.11)
RDW: 14.6 % (ref 11.5–15.5)
WBC: 3.7 10*3/uL — ABNORMAL LOW (ref 4.0–10.5)
nRBC: 0 % (ref 0.0–0.2)

## 2019-02-19 MED ORDER — ONDANSETRON HCL 4 MG/2ML IJ SOLN
4.0000 mg | Freq: Once | INTRAMUSCULAR | Status: AC
  Administered 2019-02-19: 4 mg via INTRAVENOUS
  Filled 2019-02-19: qty 2

## 2019-02-19 MED ORDER — LACTATED RINGERS IV BOLUS
1000.0000 mL | Freq: Once | INTRAVENOUS | Status: AC
  Administered 2019-02-19: 1000 mL via INTRAVENOUS

## 2019-02-19 MED ORDER — ACETAMINOPHEN 325 MG PO TABS
650.0000 mg | ORAL_TABLET | Freq: Once | ORAL | Status: AC
  Administered 2019-02-19: 650 mg via ORAL
  Filled 2019-02-19: qty 2

## 2019-02-19 NOTE — ED Provider Notes (Addendum)
[facility] EMERGENCY DEPARTMENT Provider Note   CSN: [id number] Arrival date & time: 02/19/19  2038    History   Chief Complaint Chief Complaint  Patient presents with  . Flu Like Symptoms    HPI Kylie Smith is a 23 y.o. female.      Fever  Temp source:  Oral Severity:  Moderate Onset quality:  Gradual Duration:  3 days Timing:  Intermittent Chronicity:  New Relieved by:  None tried Ineffective treatments:  None tried Associated symptoms: chills, congestion, cough, diarrhea, dysuria, myalgias, nausea, rhinorrhea, sore throat and vomiting   Associated symptoms: no chest pain, no ear pain and no rash   Risk factors: recent travel     History reviewed. No pertinent past medical history.  Patient Active Problem List   Diagnosis Date Noted  . Chlamydia 03/28/2017  . Yeast vaginitis 03/03/2017    History reviewed. No pertinent surgical history.   OB History    Gravida  0   Para  0   Term  0   Preterm  0   AB  0   Living  0     SAB  0   TAB  0   Ectopic  0   Multiple  0   Live Births  0            Home Medications    Prior to Admission medications   Medication Sig Start Date End Date Taking? Authorizing Provider  drospirenone-ethinyl estradiol (LORYNA) 3-0.02 MG tablet Take 1 tablet by mouth daily. 03/20/18  Yes Harrington Challenger, NP  lisdexamfetamine (VYVANSE) 30 MG capsule Take 1 capsule (30 mg total) by mouth daily. Patient not taking: Reported on 07/18/2018 03/28/17   Harrington Challenger, NP    Family History History reviewed. No pertinent family history.  Social History Social History   Tobacco Use  . Smoking status: Never Smoker  . Smokeless tobacco: Never Used  Substance Use Topics  . Alcohol use: No  . Drug use: No     Allergies   Patient has no known allergies.   Review of Systems Review of Systems  Constitutional: Positive for chills and fever.  HENT: Positive for congestion, rhinorrhea and sore  throat. Negative for ear pain.   Eyes: Negative for pain and visual disturbance.  Respiratory: Positive for cough. Negative for shortness of breath.   Cardiovascular: Negative for chest pain and palpitations.  Gastrointestinal: Positive for diarrhea, nausea and vomiting. Negative for abdominal pain.  Genitourinary: Positive for dysuria. Negative for hematuria.  Musculoskeletal: Positive for myalgias. Negative for arthralgias and back pain.  Skin: Negative for color change and rash.  Neurological: Negative for seizures and syncope.  All other systems reviewed and are negative.    Physical Exam Updated Vital Signs BP 119/80   Pulse (!) 102   Temp (!) 102.1 F (38.9 C) (Oral)   Resp 18   Ht 5\' 6"  (1.676 m)   Wt 63.5 kg   LMP 02/15/2019   SpO2 98%   BMI 22.60 kg/m   Physical Exam Vitals signs and nursing note reviewed.  Constitutional:      General: She is not in acute distress.    Appearance: She is well-developed.  HENT:     Head: Normocephalic and atraumatic.  Eyes:     Conjunctiva/sclera: Conjunctivae normal.  Neck:     Musculoskeletal: Neck supple.  Cardiovascular:     Rate and Rhythm: Normal rate and regular rhythm.  Heart sounds: No murmur.  Pulmonary:     Effort: Pulmonary effort is normal. No respiratory distress.     Breath sounds: Normal breath sounds.  Abdominal:     Palpations: Abdomen is soft.     Tenderness: There is no abdominal tenderness.  Skin:    General: Skin is warm and dry.  Neurological:     Mental Status: She is alert.      ED Treatments / Results  Labs (all labs ordered are listed, but only abnormal results are displayed) Labs Reviewed  RESPIRATORY PANEL BY PCR - Abnormal; Notable for the following components:      Result Value   Influenza A H1 2009 DETECTED (*)    All other components within normal limits  CBC WITH DIFFERENTIAL/PLATELET - Abnormal; Notable for the following components:   WBC 3.7 (*)    Hemoglobin 11.6 (*)     Lymphs Abs 0.5 (*)    All other components within normal limits  BASIC METABOLIC PANEL - Abnormal; Notable for the following components:   Sodium 134 (*)    Potassium 3.4 (*)    Calcium 8.7 (*)    All other components within normal limits  URINALYSIS, COMPLETE (UACMP) WITH MICROSCOPIC - Abnormal; Notable for the following components:   Color, Urine STRAW (*)    Specific Gravity, Urine 1.003 (*)    Hgb urine dipstick LARGE (*)    Leukocytes,Ua SMALL (*)    Bacteria, UA RARE (*)    All other components within normal limits  POC URINE PREG, ED    EKG None  Radiology Dg Chest Portable 1 View  Result Date: 02/19/2019 CLINICAL DATA:  23 y/o F; fever, cough, shortness of breath, and chills for 2 days. EXAM: PORTABLE CHEST 1 VIEW COMPARISON:  None. FINDINGS: The heart size and mediastinal contours are within normal limits. Both lungs are clear. The visualized skeletal structures are unremarkable. IMPRESSION: No active disease. Electronically Signed   By: Mitzi Hansen M.D.   On: 02/19/2019 22:34    Procedures Procedures (including critical care time)  Medications Ordered in ED Medications  lactated ringers bolus 1,000 mL (0 mLs Intravenous Stopped 02/20/19 0028)  ondansetron (ZOFRAN) injection 4 mg (4 mg Intravenous Given 02/19/19 2247)  acetaminophen (TYLENOL) tablet 650 mg (650 mg Oral Given 02/19/19 2247)     Initial Impression / Assessment and Plan / ED Course  I have reviewed the triage vital signs and the nursing notes.  Pertinent labs & imaging results that were available during my care of the patient were reviewed by me and considered in my medical decision making (see chart for details).        Patient is a 23 year old female with no significant past medical history who presents emergency department complaining of fever, nausea, vomiting, myalgias, dysuria, diarrhea, and congestion.  Patient states that she recently traveled to [facility] for 6 days.  She started  feeling symptoms 3 days ago while in [place] which progressively worsened since returning home to [place].  She does not remember coming into contact with any second visuals while on her trip however 2 of her friends have displayed similar symptoms.  She states that since returning home she has had decreased appetite and slow for the past 12 hours secondary to general malaise.  Given patient's travel history appropriate PEs were utilized.  On initial evaluation the patient she was hemodynamically stable nontoxic-appearing.  Patient was febrile to 102.1 Fahrenheit, slightly tachycardic at 118, normotensive, satting 99% on  room air.  Physical exam as detailed above which is large unremarkable with overall healthy appearing 23 year old female.  Lungs are clear to auscultation bilaterally, abdomen is benign, and neck is supple with no meningeal signs. Patient is not septic.   Given short duration of the patient's symptoms, viral process is most likely etiology, however will check for pneumonia, urinary tract infection, and obtain blood work given poor p.o. intake and nausea vomiting.  Suspicion for novel coronavirus is low given patient's travel destination and lack of contact with known virus carriers.   She was given IV fluid bolus, IV Zofran, and Tylenol for symptomatic relief.  CBC with no leukocytosis.  Hemoglobin 11.6 (currently on period).  Significant metabolic or electrolyte derangements.  Chest x-ray shows no evidence of pneumonia.  UA consistent with current menstrual cycle. No evidence of infection. U preg negative.  RVP positive for influenza A. Given duration of symptoms she does not qualify for Tamiflu. Educated on symptomatic treatment. I discussed concerning signs and symptoms that would necessitate return to the ED. Patient voiced understanding of these instructions and had no further questions at this time. Tachycardia improved with fluids and defervescence. No O2 requirement  and tolerating PO. Stable for discharge.   Final Clinical Impressions(s) / ED Diagnoses   Final diagnoses:  Viral URI with cough  Influenza A    ED Discharge Orders    None       Leonette Monarch, MD 02/20/19 0127    Leonette Monarch, MD 02/20/19 2979    Marily Memos, MD 02/20/19 1931

## 2019-02-19 NOTE — ED Triage Notes (Signed)
Pt arrives POV for eval of fever, cough/SOB, chills. Pt report recent travel to [place] w/ significant time spent in airport. Pt reports roommate whom she traveled w/ is also sick. Febrile to 102 in triage. Pt placed in mask and charge nurse alerted

## 2019-02-20 LAB — RESPIRATORY PANEL BY PCR
ADENOVIRUS-RVPPCR: NOT DETECTED
Bordetella pertussis: NOT DETECTED
CORONAVIRUS NL63-RVPPCR: NOT DETECTED
Chlamydophila pneumoniae: NOT DETECTED
Coronavirus 229E: NOT DETECTED
Coronavirus HKU1: NOT DETECTED
Coronavirus OC43: NOT DETECTED
Influenza A H1 2009: DETECTED — AB
Influenza B: NOT DETECTED
Metapneumovirus: NOT DETECTED
Mycoplasma pneumoniae: NOT DETECTED
PARAINFLUENZA VIRUS 1-RVPPCR: NOT DETECTED
Parainfluenza Virus 2: NOT DETECTED
Parainfluenza Virus 3: NOT DETECTED
Parainfluenza Virus 4: NOT DETECTED
Respiratory Syncytial Virus: NOT DETECTED
Rhinovirus / Enterovirus: NOT DETECTED

## 2019-02-20 LAB — URINALYSIS, COMPLETE (UACMP) WITH MICROSCOPIC
Bilirubin Urine: NEGATIVE
Glucose, UA: NEGATIVE mg/dL
Ketones, ur: NEGATIVE mg/dL
Nitrite: NEGATIVE
Protein, ur: NEGATIVE mg/dL
Specific Gravity, Urine: 1.003 — ABNORMAL LOW (ref 1.005–1.030)
pH: 7 (ref 5.0–8.0)

## 2019-02-20 LAB — POC URINE PREG, ED: Preg Test, Ur: NEGATIVE

## 2019-02-22 ENCOUNTER — Emergency Department (HOSPITAL_COMMUNITY): Payer: Federal, State, Local not specified - PPO

## 2019-02-22 ENCOUNTER — Emergency Department (HOSPITAL_COMMUNITY)
Admission: EM | Admit: 2019-02-22 | Discharge: 2019-02-22 | Disposition: A | Discharge status: Home / Self Care | Payer: Federal, State, Local not specified - PPO | Attending: Emergency Medicine | Admitting: Emergency Medicine

## 2019-02-22 ENCOUNTER — Other Ambulatory Visit: Payer: Self-pay

## 2019-02-22 ENCOUNTER — Encounter (HOSPITAL_COMMUNITY): Payer: Self-pay | Admitting: Emergency Medicine

## 2019-02-22 DIAGNOSIS — Z79899 Other long term (current) drug therapy: Secondary | ICD-10-CM | POA: Insufficient documentation

## 2019-02-22 DIAGNOSIS — R05 Cough: Secondary | ICD-10-CM | POA: Diagnosis present

## 2019-02-22 DIAGNOSIS — J101 Influenza due to other identified influenza virus with other respiratory manifestations: Secondary | ICD-10-CM | POA: Insufficient documentation

## 2019-02-22 LAB — BASIC METABOLIC PANEL
Anion gap: 10 (ref 5–15)
BUN: 5 mg/dL — ABNORMAL LOW (ref 6–20)
CO2: 24 mmol/L (ref 22–32)
Calcium: 8.9 mg/dL (ref 8.9–10.3)
Chloride: 102 mmol/L (ref 98–111)
Creatinine, Ser: 0.67 mg/dL (ref 0.44–1.00)
GFR calc Af Amer: >60 mL/min (ref >60)
GFR calc non Af Amer: >60 mL/min (ref >60)
GLUCOSE: 95 mg/dL (ref 70–99)
Potassium: 4 mmol/L (ref 3.5–5.1)
Sodium: 136 mmol/L (ref 135–145)

## 2019-02-22 LAB — CBC WITH DIFFERENTIAL/PLATELET
Abs Immature Granulocytes: 0.02 10*3/uL (ref 0.00–0.07)
Basophils Absolute: 0 10*3/uL (ref 0.0–0.1)
Basophils Relative: 0 %
Eosinophils Absolute: 0 10*3/uL (ref 0.0–0.5)
Eosinophils Relative: 1 %
HEMATOCRIT: 41 % (ref 36.0–46.0)
Hemoglobin: 12.8 g/dL (ref 12.0–15.0)
Immature Granulocytes: 1 %
Lymphocytes Relative: 21 %
Lymphs Abs: 0.8 10*3/uL (ref 0.7–4.0)
MCH: 26.2 pg (ref 26.0–34.0)
MCHC: 31.2 g/dL (ref 30.0–36.0)
MCV: 84 fL (ref 80.0–100.0)
Monocytes Absolute: 0.5 10*3/uL (ref 0.1–1.0)
Monocytes Relative: 13 %
Neutro Abs: 2.4 10*3/uL (ref 1.7–7.7)
Neutrophils Relative %: 64 %
Platelets: 240 10*3/uL (ref 150–400)
RBC: 4.88 MIL/uL (ref 3.87–5.11)
RDW: 14 % (ref 11.5–15.5)
WBC: 3.8 10*3/uL — ABNORMAL LOW (ref 4.0–10.5)
nRBC: 0 % (ref 0.0–0.2)

## 2019-02-22 LAB — I-STAT BETA HCG BLOOD, ED (MC, WL, AP ONLY): I-stat hCG, quantitative: <5 m[IU]/mL (ref <5)

## 2019-02-22 MED ORDER — AEROCHAMBER PLUS FLO-VU LARGE MISC: 1.0000 | Freq: Once | Status: DC

## 2019-02-22 MED ORDER — ALBUTEROL SULFATE HFA 108 (90 BASE) MCG/ACT IN AERS
1.0000 | INHALATION_SPRAY | RESPIRATORY_TRACT | Status: DC | PRN
  Filled 2019-02-22: qty 6.7

## 2019-02-22 MED ORDER — OXYMETAZOLINE HCL 0.05 % NA SOLN
1.0000 | Freq: Once | NASAL | Status: DC
  Filled 2019-02-22: qty 30

## 2019-02-22 MED ORDER — IBUPROFEN 600 MG PO TABS: 600.0000 mg | ORAL_TABLET | Freq: Four times a day (QID) | ORAL | 0 refills | Status: DC | PRN

## 2019-02-22 MED ORDER — IBUPROFEN 400 MG PO TABS: 600.0000 mg | ORAL_TABLET | Freq: Once | ORAL | Status: DC

## 2019-02-22 MED ORDER — GUAIFENESIN-CODEINE 100-10 MG/5ML PO SOLN: 5.0000 mL | Freq: Three times a day (TID) | ORAL | 0 refills | Status: DC | PRN

## 2019-02-22 NOTE — ED Triage Notes (Signed)
Patient reports persistent productive cough , chest congestion , rhinorrhea and sore throat this week , diagnosed with influenza Monday this week with no improvement . Denies fever /respirations unlabored , no foreign travel or sick exposure at work or at home .

## 2019-02-22 NOTE — ED Provider Notes (Signed)
[facility] EMERGENCY DEPARTMENT Provider Note   CSN: [id number] Arrival date & time: 02/22/19  1916    History   Chief Complaint Chief Complaint  Patient presents with  . Cough/Nasal Congestion/Flu    HPI Kylie Smith is a 23 y.o. female.     Pt presents to the ED today with continued cough and sinus congestion.  She has had sx for several days.  She was seen here on 3/9 and diagnosed with Influenza A.  She was out of the window for Tamiflu.  She still feels poorly and can't sleep at night due to cough.       History reviewed. No pertinent past medical history.  Patient Active Problem List   Diagnosis Date Noted  . Chlamydia 03/28/2017  . Yeast vaginitis 03/03/2017    History reviewed. No pertinent surgical history.   OB History    Gravida  0   Para  0   Term  0   Preterm  0   AB  0   Living  0     SAB  0   TAB  0   Ectopic  0   Multiple  0   Live Births  0            Home Medications    Prior to Admission medications   Medication Sig Start Date End Date Taking? Authorizing Provider  drospirenone-ethinyl estradiol (LORYNA) 3-0.02 MG tablet Take 1 tablet by mouth daily. 03/20/18   Harrington Challenger, NP  guaiFENesin-codeine 100-10 MG/5ML syrup Take 5 mLs by mouth 3 (three) times daily as needed for cough. 02/22/19   Jacalyn Lefevre, MD  ibuprofen (ADVIL,MOTRIN) 600 MG tablet Take 1 tablet (600 mg total) by mouth every 6 (six) hours as needed. 02/22/19   Jacalyn Lefevre, MD  lisdexamfetamine (VYVANSE) 30 MG capsule Take 1 capsule (30 mg total) by mouth daily. Patient not taking: Reported on 07/18/2018 03/28/17   Harrington Challenger, NP    Family History No family history on file.  Social History Social History   Tobacco Use  . Smoking status: Never Smoker  . Smokeless tobacco: Never Used  Substance Use Topics  . Alcohol use: No  . Drug use: No     Allergies   Patient has no known allergies.   Review of Systems Review of  Systems  HENT: Positive for congestion, ear pain and sinus pressure.   Respiratory: Positive for cough.   All other systems reviewed and are negative.    Physical Exam Updated Vital Signs BP 137/78 (BP Location: Right Arm)   Pulse (!) 105   Temp 98.3 F (36.8 C) (Oral)   Resp 18   Ht 5\' 6"  (1.676 m)   Wt 63.5 kg   LMP 02/15/2019   SpO2 100%   BMI 22.60 kg/m   Physical Exam Vitals signs and nursing note reviewed.  Constitutional:      Appearance: Normal appearance.  HENT:     Head: Normocephalic and atraumatic.     Right Ear: Tympanic membrane, ear canal and external ear normal.     Left Ear: Tympanic membrane, ear canal and external ear normal.     Nose: Congestion present.     Mouth/Throat:     Mouth: Mucous membranes are moist.  Eyes:     Extraocular Movements: Extraocular movements intact.     Conjunctiva/sclera: Conjunctivae normal.     Pupils: Pupils are equal, round, and reactive to light.  Neck:  Musculoskeletal: Normal range of motion and neck supple.  Cardiovascular:     Rate and Rhythm: Normal rate and regular rhythm.     Pulses: Normal pulses.     Heart sounds: Normal heart sounds.  Pulmonary:     Breath sounds: Wheezing present.  Abdominal:     General: Abdomen is flat.     Palpations: Abdomen is soft.  Musculoskeletal: Normal range of motion.  Skin:    General: Skin is warm and dry.     Capillary Refill: Capillary refill takes less than 2 seconds.  Neurological:     General: No focal deficit present.     Mental Status: She is alert and oriented to person, place, and time.  Psychiatric:        Mood and Affect: Mood normal.        Behavior: Behavior normal.      ED Treatments / Results  Labs (all labs ordered are listed, but only abnormal results are displayed) Labs Reviewed  CBC WITH DIFFERENTIAL/PLATELET - Abnormal; Notable for the following components:      Result Value   WBC 3.8 (*)    All other components within normal limits   BASIC METABOLIC PANEL - Abnormal; Notable for the following components:   BUN 5 (*)    All other components within normal limits  I-STAT BETA HCG BLOOD, ED ([facility], [facility], [facility] ONLY)    EKG None  Radiology Dg Chest 2 View  Result Date: 02/22/2019 CLINICAL DATA:  Cough and chest congestion EXAM: CHEST - 2 VIEW COMPARISON:  02/19/2019 FINDINGS: The heart size and mediastinal contours are within normal limits. Both lungs are clear. The visualized skeletal structures are unremarkable. IMPRESSION: Clear lungs Electronically Signed   By: Deatra Robinson M.D.   On: 02/22/2019 20:27    Procedures Procedures (including critical care time)  Medications Ordered in ED Medications  ibuprofen (ADVIL,MOTRIN) tablet 600 mg (has no administration in time range)  oxymetazoline (AFRIN) 0.05 % nasal spray 1 spray (has no administration in time range)  albuterol (PROVENTIL HFA;VENTOLIN HFA) 108 (90 Base) MCG/ACT inhaler 1-2 puff (has no administration in time range)  AeroChamber Plus Flo-Vu Large MISC 1 each (has no administration in time range)     Initial Impression / Assessment and Plan / ED Course  I have reviewed the triage vital signs and the nursing notes.  Pertinent labs & imaging results that were available during my care of the patient were reviewed by me and considered in my medical decision making (see chart for details).       Pt has a few wheezes, so she is given an inhaler and a spacer.  She is instructed to alternate tylenol and ibuprofen for fever.  She is given afrin for her sinus and ear pain.  She is also given a rx for robitussin cough syrup with codeine.  Return if worse.  F/u with [facility].   Final Clinical Impressions(s) / ED Diagnoses   Final diagnoses:  Influenza A    ED Discharge Orders         Ordered    ibuprofen (ADVIL,MOTRIN) 600 MG tablet  Every 6 hours PRN     02/22/19 2229    guaiFENesin-codeine 100-10 MG/5ML syrup  3 times daily PRN     02/22/19 2229            Jacalyn Lefevre, MD 02/22/19 2244

## 2019-02-22 NOTE — ED Notes (Signed)
Reviewed d/c instructions with pt, who verbalized understanding and had no outstanding questions. Pt departed in NAD, refused use of wheelchair.   

## 2019-02-26 ENCOUNTER — Encounter (HOSPITAL_COMMUNITY): Payer: Self-pay

## 2019-02-26 ENCOUNTER — Emergency Department (HOSPITAL_COMMUNITY): Payer: Federal, State, Local not specified - PPO

## 2019-02-26 ENCOUNTER — Emergency Department (HOSPITAL_COMMUNITY)
Admission: EM | Admit: 2019-02-26 | Discharge: 2019-02-26 | Disposition: A | Discharge status: Home / Self Care | Payer: Federal, State, Local not specified - PPO | Attending: Emergency Medicine | Admitting: Emergency Medicine

## 2019-02-26 DIAGNOSIS — Y999 Unspecified external cause status: Secondary | ICD-10-CM | POA: Diagnosis not present

## 2019-02-26 DIAGNOSIS — S0081XA Abrasion of other part of head, initial encounter: Secondary | ICD-10-CM | POA: Diagnosis not present

## 2019-02-26 DIAGNOSIS — S00511A Abrasion of lip, initial encounter: Secondary | ICD-10-CM | POA: Diagnosis not present

## 2019-02-26 DIAGNOSIS — S0181XA Laceration without foreign body of other part of head, initial encounter: Secondary | ICD-10-CM

## 2019-02-26 DIAGNOSIS — Y9389 Activity, other specified: Secondary | ICD-10-CM | POA: Diagnosis not present

## 2019-02-26 DIAGNOSIS — S01112A Laceration without foreign body of left eyelid and periocular area, initial encounter: Secondary | ICD-10-CM | POA: Insufficient documentation

## 2019-02-26 DIAGNOSIS — S0990XA Unspecified injury of head, initial encounter: Secondary | ICD-10-CM | POA: Diagnosis present

## 2019-02-26 DIAGNOSIS — Y92009 Unspecified place in unspecified non-institutional (private) residence as the place of occurrence of the external cause: Secondary | ICD-10-CM | POA: Insufficient documentation

## 2019-02-26 DIAGNOSIS — Z79899 Other long term (current) drug therapy: Secondary | ICD-10-CM | POA: Diagnosis not present

## 2019-02-26 MED ORDER — LIDOCAINE-EPINEPHRINE (PF) 2 %-1:200000 IJ SOLN
20.0000 mL | Freq: Once | INTRAMUSCULAR | Status: AC
  Administered 2019-02-26: 20 mL
  Filled 2019-02-26: qty 20

## 2019-02-26 MED ORDER — LIDOCAINE HCL 2 % IJ SOLN
10.0000 mL | Freq: Once | INTRAMUSCULAR | Status: DC
  Filled 2019-02-26: qty 20
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
## 2019-02-26 NOTE — ED Notes (Signed)
Lido given to EDP

## 2019-02-26 NOTE — Discharge Instructions (Signed)
Follow-up for suture removal in 5 to 7 days.  Use ice as needed for the pain and swelling in your face.  Return to the ED with worsening swelling, vision changes, dizziness, vomiting, behavior changes or any other concerns.
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
## 2019-03-03 ENCOUNTER — Ambulatory Visit (HOSPITAL_COMMUNITY)
Admission: EM | Admit: 2019-03-03 | Discharge: 2019-03-03 | Disposition: A | Discharge status: Home / Self Care | Payer: Federal, State, Local not specified - PPO | Attending: Family Medicine | Admitting: Family Medicine

## 2019-03-03 ENCOUNTER — Encounter (HOSPITAL_COMMUNITY): Payer: Self-pay | Admitting: *Deleted

## 2019-03-03 ENCOUNTER — Other Ambulatory Visit: Payer: Self-pay

## 2019-03-03 DIAGNOSIS — S0181XA Laceration without foreign body of other part of head, initial encounter: Secondary | ICD-10-CM | POA: Diagnosis not present

## 2019-03-03 MED ORDER — FLUCONAZOLE 150 MG PO TABS: ORAL_TABLET | ORAL | 0 refills | Status: DC

## 2019-03-03 MED ORDER — SULFAMETHOXAZOLE-TRIMETHOPRIM 800-160 MG PO TABS: 1.0000 | ORAL_TABLET | Freq: Two times a day (BID) | ORAL | 0 refills | Status: AC

## 2019-03-03 NOTE — ED Triage Notes (Signed)
Reports having sutures place on left brow area x 5 days; c/o greenish drainage from wound.
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
## 2019-03-09 ENCOUNTER — Ambulatory Visit (HOSPITAL_COMMUNITY)
Admission: EM | Admit: 2019-03-09 | Discharge: 2019-03-09 | Disposition: A | Discharge status: Home / Self Care | Payer: Federal, State, Local not specified - PPO

## 2019-03-09 DIAGNOSIS — Z4802 Encounter for removal of sutures: Secondary | ICD-10-CM | POA: Diagnosis not present

## 2019-03-09 DIAGNOSIS — S0181XD Laceration without foreign body of other part of head, subsequent encounter: Secondary | ICD-10-CM

## 2019-03-09 NOTE — ED Triage Notes (Addendum)
Pt here for suture removal. WOund healing well. Pt taking antibiotics for infection. Encouraged to finsih antibiotics. 7 sutures removed. All questions answered

## 2019-07-24 ENCOUNTER — Telehealth: Payer: Self-pay | Admitting: *Deleted

## 2019-07-24 NOTE — Telephone Encounter (Signed)
Patient called and left message c/o BV infection requesting Rx sent to pharmacy. I called patient back and left detailed message on cell she needs to schedule OV for treatment and also schedule annual exam.

## 2019-08-06 ENCOUNTER — Other Ambulatory Visit: Payer: Self-pay

## 2019-08-07 ENCOUNTER — Ambulatory Visit (INDEPENDENT_AMBULATORY_CARE_PROVIDER_SITE_OTHER): Payer: Federal, State, Local not specified - PPO | Admitting: Women's Health

## 2019-08-07 ENCOUNTER — Encounter: Payer: Self-pay | Admitting: Women's Health

## 2019-08-07 VITALS — BP 120/78 | Ht 66.0 in | Wt 147.0 lb

## 2019-08-07 DIAGNOSIS — Z113 Encounter for screening for infections with a predominantly sexual mode of transmission: Secondary | ICD-10-CM | POA: Diagnosis not present

## 2019-08-07 DIAGNOSIS — N898 Other specified noninflammatory disorders of vagina: Secondary | ICD-10-CM | POA: Diagnosis not present

## 2019-08-07 DIAGNOSIS — Z01419 Encounter for gynecological examination (general) (routine) without abnormal findings: Secondary | ICD-10-CM | POA: Diagnosis not present

## 2019-08-07 DIAGNOSIS — Z23 Encounter for immunization: Secondary | ICD-10-CM | POA: Diagnosis not present

## 2019-08-07 DIAGNOSIS — Z30011 Encounter for initial prescription of contraceptive pills: Secondary | ICD-10-CM

## 2019-08-07 DIAGNOSIS — N760 Acute vaginitis: Secondary | ICD-10-CM

## 2019-08-07 DIAGNOSIS — B9689 Other specified bacterial agents as the cause of diseases classified elsewhere: Secondary | ICD-10-CM

## 2019-08-07 DIAGNOSIS — B3731 Acute candidiasis of vulva and vagina: Secondary | ICD-10-CM

## 2019-08-07 DIAGNOSIS — B373 Candidiasis of vulva and vagina: Secondary | ICD-10-CM

## 2019-08-07 LAB — WET PREP FOR TRICH, YEAST, CLUE

## 2019-08-07 MED ORDER — DROSPIRENONE-ETHINYL ESTRADIOL 3-0.02 MG PO TABS: 1.0000 | ORAL_TABLET | Freq: Every day | ORAL | 4 refills | Status: DC

## 2019-08-07 MED ORDER — FLUCONAZOLE 150 MG PO TABS: 150.0000 mg | ORAL_TABLET | Freq: Once | ORAL | 1 refills | Status: AC

## 2019-08-07 MED ORDER — METRONIDAZOLE 500 MG PO TABS: 500.0000 mg | ORAL_TABLET | Freq: Two times a day (BID) | ORAL | 0 refills | Status: DC

## 2019-08-07 NOTE — Patient Instructions (Addendum)
Health Maintenance, Female Adopting a healthy lifestyle and getting preventive care are important in promoting health and wellness. Ask your health care provider about:  The right schedule for you to have regular tests and exams.  Things you can do on your own to prevent diseases and keep yourself healthy. What should I know about diet, weight, and exercise? Eat a healthy diet   Eat a diet that includes plenty of vegetables, fruits, low-fat dairy products, and lean protein.  Do not eat a lot of foods that are high in solid fats, added sugars, or sodium. Maintain a healthy weight Body mass index (BMI) is used to identify weight problems. It estimates body fat based on height and weight. Your health care provider can help determine your BMI and help you achieve or maintain a healthy weight. Get regular exercise Get regular exercise. This is one of the most important things you can do for your health. Most adults should:  Exercise for at least 150 minutes each week. The exercise should increase your heart rate and make you sweat (moderate-intensity exercise).  Do strengthening exercises at least twice a week. This is in addition to the moderate-intensity exercise.  Spend less time sitting. Even light physical activity can be beneficial. Watch cholesterol and blood lipids Have your blood tested for lipids and cholesterol at 20 years of age, then have this test every 5 years. Have your cholesterol levels checked more often if:  Your lipid or cholesterol levels are high.  You are older than 21 years of age.  You are at high risk for heart disease. What should I know about cancer screening? Depending on your health history and family history, you may need to have cancer screening at various ages. This may include screening for:  Breast cancer.  Cervical cancer.  Colorectal cancer.  Skin cancer.  Lung cancer. What should I know about heart disease, diabetes, and high blood  pressure? Blood pressure and heart disease  High blood pressure causes heart disease and increases the risk of stroke. This is more likely to develop in people who have high blood pressure readings, are of African descent, or are overweight.  Have your blood pressure checked: ? Every 3-5 years if you are 18-39 years of age. ? Every year if you are 40 years old or older. Diabetes Have regular diabetes screenings. This checks your fasting blood sugar level. Have the screening done:  Once every three years after age 40 if you are at a normal weight and have a low risk for diabetes.  More often and at a younger age if you are overweight or have a high risk for diabetes. What should I know about preventing infection? Hepatitis B If you have a higher risk for hepatitis B, you should be screened for this virus. Talk with your health care provider to find out if you are at risk for hepatitis B infection. Hepatitis C Testing is recommended for:  Everyone born from 1945 through 1965.  Anyone with known risk factors for hepatitis C. Sexually transmitted infections (STIs)  Get screened for STIs, including gonorrhea and chlamydia, if: ? You are sexually active and are younger than 21 years of age. ? You are older than 21 years of age and your health care provider tells you that you are at risk for this type of infection. ? Your sexual activity has changed since you were last screened, and you are at increased risk for chlamydia or gonorrhea. Ask your health care provider if   you are at risk.  Ask your health care provider about whether you are at high risk for HIV. Your health care provider may recommend a prescription medicine to help prevent HIV infection. If you choose to take medicine to prevent HIV, you should first get tested for HIV. You should then be tested every 3 months for as long as you are taking the medicine. Pregnancy  If you are about to stop having your period (premenopausal) and  you may become pregnant, seek counseling before you get pregnant.  Take 400 to 800 micrograms (mcg) of folic acid every day if you become pregnant.  Ask for birth control (contraception) if you want to prevent pregnancy. Osteoporosis and menopause Osteoporosis is a disease in which the bones lose minerals and strength with aging. This can result in bone fractures. If you are 21 years old or older, or if you are at risk for osteoporosis and fractures, ask your health care provider if you should:  Be screened for bone loss.  Take a calcium or vitamin D supplement to lower your risk of fractures.  Be given hormone replacement therapy (HRT) to treat symptoms of menopause. Follow these instructions at home: Lifestyle  Do not use any products that contain nicotine or tobacco, such as cigarettes, e-cigarettes, and chewing tobacco. If you need help quitting, ask your health care provider.  Do not use street drugs.  Do not share needles.  Ask your health care provider for help if you need support or information about quitting drugs. Alcohol use  Do not drink alcohol if: ? Your health care provider tells you not to drink. ? You are pregnant, may be pregnant, or are planning to become pregnant.  If you drink alcohol: ? Limit how much you use to 0-1 drink a day. ? Limit intake if you are breastfeeding.  Be aware of how much alcohol is in your drink. In the [place] one drink equals one 12 oz bottle of beer (355 mL), one 5 oz glass of wine (148 mL), or one 1 oz glass of hard liquor (44 mL). General instructions  Schedule regular health, dental, and eye exams.  Stay current with your vaccines.  Tell your health care provider if: ? You often feel depressed. ? You have ever been abused or do not feel safe at home. Summary  Adopting a healthy lifestyle and getting preventive care are important in promoting health and wellness.  Follow your health care provider's instructions about healthy  diet, exercising, and getting tested or screened for diseases.  Follow your health care provider's instructions on monitoring your cholesterol and blood pressure. This information is not intended to replace advice given to you by your health care provider. Make sure you discuss any questions you have with your health care provider. Document Released: 06/14/2011 Document Revised: 11/22/2018 Document Reviewed: 11/22/2018 Elsevier Patient Education  2020 [employer]  Bacterial Vaginosis  Bacterial vaginosis is an infection of the vagina. It happens when too many normal germs (healthy bacteria) grow in the vagina. This infection puts you at risk for infections from sex (STIs). Treating this infection can lower your risk for some STIs. You should also treat this if you are pregnant. It can cause your baby to be born early. Follow these instructions at home: Medicines  Take over-the-counter and prescription medicines only as told by your doctor.  Take or use your antibiotic medicine as told by your doctor. Do not stop taking or using it even if you start to  feel better. General instructions  If you your sexual partner is a woman, tell her that you have this infection. She needs to get treatment if she has symptoms. If you have a female partner, he does not need to be treated.  During treatment: ? Avoid sex. ? Do not douche. ? Avoid alcohol as told. ? Avoid breastfeeding as told.  Drink enough fluid to keep your pee (urine) clear or pale yellow.  Keep your vagina and butt (rectum) clean. ? Wash the area with warm water every day. ? Wipe from front to back after you use the toilet.  Keep all follow-up visits as told by your doctor. This is important. Preventing this condition  Do not douche.  Use only warm water to wash around your vagina.  Use protection when you have sex. This includes: ? Latex condoms. ? Dental dams.  Limit how many people you have sex with. It is best to only  have sex with the same person (be monogamous).  Get tested for STIs. Have your partner get tested.  Wear underwear that is cotton or lined with cotton.  Avoid tight pants and pantyhose. This is most important in summer.  Do not use any products that have nicotine or tobacco in them. These include cigarettes and e-cigarettes. If you need help quitting, ask your doctor.  Do not use illegal drugs.  Limit how much alcohol you drink. Contact a doctor if:  Your symptoms do not get better, even after you are treated.  You have more discharge or pain when you pee (urinate).  You have a fever.  You have pain in your belly (abdomen).  You have pain with sex.  Your bleed from your vagina between periods. Summary  This infection happens when too many germs (bacteria) grow in the vagina.  Treating this condition can lower your risk for some infections from sex (STIs).  You should also treat this if you are pregnant. It can cause early (premature) birth.  Do not stop taking or using your antibiotic medicine even if you start to feel better. This information is not intended to replace advice given to you by your health care provider. Make sure you discuss any questions you have with your health care provider. Document Released: 09/07/2008 Document Revised: 11/11/2017 Document Reviewed: 08/14/2016 Elsevier Patient Education  2020 [employer].
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
## 2019-08-08 ENCOUNTER — Ambulatory Visit (INDEPENDENT_AMBULATORY_CARE_PROVIDER_SITE_OTHER): Payer: Federal, State, Local not specified - PPO | Admitting: *Deleted

## 2019-08-08 ENCOUNTER — Other Ambulatory Visit: Payer: Self-pay

## 2019-08-08 ENCOUNTER — Ambulatory Visit: Payer: Self-pay

## 2019-08-08 DIAGNOSIS — A549 Gonococcal infection, unspecified: Secondary | ICD-10-CM

## 2019-08-08 DIAGNOSIS — A749 Chlamydial infection, unspecified: Secondary | ICD-10-CM | POA: Diagnosis not present

## 2019-08-08 LAB — PAP IG W/ RFLX HPV ASCU

## 2019-08-08 LAB — CBC WITH DIFFERENTIAL/PLATELET
Absolute Monocytes: 331 cells/uL (ref 200–950)
Basophils Absolute: 29 cells/uL (ref 0–200)
Basophils Relative: 0.5 %
Eosinophils Absolute: 278 cells/uL (ref 15–500)
Eosinophils Relative: 4.8 %
HCT: 39.4 % (ref 35.0–45.0)
Hemoglobin: 13 g/dL (ref 11.7–15.5)
Lymphs Abs: 1143 cells/uL (ref 850–3900)
MCH: 29.1 pg (ref 27.0–33.0)
MCHC: 33 g/dL (ref 32.0–36.0)
MCV: 88.1 fL (ref 80.0–100.0)
MPV: 9.7 fL (ref 7.5–12.5)
Monocytes Relative: 5.7 %
Neutro Abs: 4019 cells/uL (ref 1500–7800)
Neutrophils Relative %: 69.3 %
Platelets: 289 10*3/uL (ref 140–400)
RBC: 4.47 10*6/uL (ref 3.80–5.10)
RDW: 13.7 % (ref 11.0–15.0)
Total Lymphocyte: 19.7 %
WBC: 5.8 10*3/uL (ref 3.8–10.8)

## 2019-08-08 LAB — C. TRACHOMATIS/N. GONORRHOEAE RNA
C. trachomatis RNA, TMA: DETECTED — AB
N. gonorrhoeae RNA, TMA: DETECTED — AB

## 2019-08-08 LAB — HIV ANTIBODY (ROUTINE TESTING W REFLEX): HIV 1&2 Ab, 4th Generation: NONREACTIVE

## 2019-08-08 LAB — RPR: RPR Ser Ql: NONREACTIVE

## 2019-08-08 MED ORDER — AZITHROMYCIN 500 MG PO TABS: 1000.0000 mg | ORAL_TABLET | Freq: Once | ORAL | 0 refills | Status: AC

## 2019-08-08 MED ORDER — CEFTRIAXONE SODIUM 250 MG IJ SOLR
250.0000 mg | Freq: Once | INTRAMUSCULAR | Status: AC
  Administered 2019-08-08: 250 mg via INTRAMUSCULAR

## 2019-08-09 ENCOUNTER — Ambulatory Visit: Payer: Self-pay

## 2019-08-27 ENCOUNTER — Other Ambulatory Visit: Payer: Self-pay

## 2019-08-28 ENCOUNTER — Ambulatory Visit: Payer: Federal, State, Local not specified - PPO | Admitting: Women's Health

## 2019-08-28 ENCOUNTER — Encounter: Payer: Self-pay | Admitting: Women's Health

## 2019-08-28 VITALS — BP 110/78

## 2019-08-28 DIAGNOSIS — Z113 Encounter for screening for infections with a predominantly sexual mode of transmission: Secondary | ICD-10-CM | POA: Diagnosis not present

## 2019-08-28 DIAGNOSIS — A549 Gonococcal infection, unspecified: Secondary | ICD-10-CM | POA: Diagnosis not present

## 2019-08-28 DIAGNOSIS — A749 Chlamydial infection, unspecified: Secondary | ICD-10-CM | POA: Diagnosis not present

## 2019-08-28 NOTE — Patient Instructions (Signed)
Gonorrhea Gonorrhea is a sexually transmitted disease (STD) that can affect both men and women. If left untreated, this infection can:  Damage the female or female organs.  Cause women and men to be unable to have children (be sterile).  Harm a fetus if an infected woman is pregnant. It is important to get treatment for gonorrhea as soon as possible. It is also necessary for all of your sexual partners to be tested for the infection. What are the causes? This condition is caused by bacteria called Neisseria gonorrhoeae. The infection is spread from person to person through sexual contact, including oral, anal, and vaginal sex. A newborn can contract the infection from his or her mother during birth. What increases the risk? The following factors may make you more likely to develop this condition:  Being a woman who is younger than 21 years of age and who is sexually active.  Being a woman 25 years of age or older who has: ? A new sex partner. ? More than one sex partner. ? A sex partner who has an STD.  Being a man who has: ? A new sex partner. ? More than one sex partner. ? A sex partner who has an STD.  Using condoms inconsistently.  Currently having, or having previously had, an STD.  Exchanging sex for money or drugs. What are the signs or symptoms? Some people do not have any symptoms. If you do have symptoms, they may be different for females and males. For females  Pain in the lower abdomen.  Abnormal vaginal discharge. The discharge may be cloudy, thick, or yellow-green in color.  Bleeding between periods.  Painful sex.  Burning or itching in and around the vagina.  Pain or burning when urinating.  Irritation, pain, bleeding, or discharge from the rectum. This may occur if the infection was spread by anal sex.  Sore throat or swollen lymph nodes in the neck. This may occur if the infection was spread by oral sex. For males  Abnormal discharge from the penis.  This discharge may be cloudy, thick, or yellow-green in color.  Pain or burning during urination.  Pain or swelling in the testicles.  Irritation, pain, bleeding, or discharge from the rectum. This may occur if the infection was spread by anal sex.  Sore throat, fever, or swollen lymph nodes in the neck. This may occur if the infection was spread by oral sex. How is this diagnosed? This condition is diagnosed based on:  A physical exam.  A sample of discharge that is examined under a microscope to look for the bacteria. The discharge may be taken from the urethra, cervix, throat, or rectum.  Urine tests. Not all of test results will be available during your visit. How is this treated? This condition is treated with antibiotic medicines. It is important for treatment to begin as soon as possible. Early treatment may prevent some problems from developing. Do not have sex during treatment. Avoid all types of sexual activity for 7 days after treatment is complete and until any sex partners have been treated. Follow these instructions at home:  Take over-the-counter and prescription medicines only as told by your health care provider.  Take your antibiotic medicine as told by your health care provider. Do not stop taking the antibiotic even if you start to feel better.  Do not have sex until at least 7 days after you and your partner(s) have finished treatment and your health care provider says it is okay.    It is your responsibility to get your test results. Ask your health care provider, or the department performing the test, when your results will be ready.  If you test positive for gonorrhea, inform your recent sexual partners. This includes any oral, anal, or vaginal sex partners. They need to be checked for gonorrhea even if they do not have symptoms. They may need treatment, even if they test negative for gonorrhea.  Keep all follow-up visits as told by your health care provider.  This is important. How is this prevented?   Use latex condoms correctly every time you have sexual intercourse.  Ask if your sexual partner has been tested for STDs and had negative results.  Avoid having multiple sexual partners. Contact a health care provider if:  You develop a bad reaction to the medicine you were prescribed. This may include: ? A rash. ? Nausea. ? Vomiting. ? Diarrhea.  Your symptoms do not get better after a few days of taking antibiotics.  Your symptoms get worse.  You develop new symptoms.  Your pain gets worse.  You have a fever.  You develop pain, itching, or discharge around the eyes. Get help right away if:  You feel dizzy or faint.  You have trouble breathing or have shortness of breath.  You develop an irregular heartbeat.  You have severe abdominal pain with or without shoulder pain.  You develop any bumps or sores (lesions) on your skin.  You develop warmth, redness, pain, or swelling around your joints, such as the knee. Summary  Gonorrhea is an STD that can affect both men and women.  This condition is caused by bacteria called Neisseria gonorrhoeae. The infection is spread from person to person, usually through sexual contact, including oral, anal, and vaginal sex.  Symptoms vary between males and females. Generally, they include abnormal discharge and burning during urination. Women may also experience painful sex, itching around the vagina, and bleeding between menstrual periods. Men may also experience swelling of the testicles.  This condition is treated with antibiotic medicines. Do not have sex until at least 7 days after completing antibiotic treatment.  If left untreated, gonorrhea can have serious side effects and complications. This information is not intended to replace advice given to you by your health care provider. Make sure you discuss any questions you have with your health care provider. Document Released:  11/26/2000 Document Revised: 01/05/2019 Document Reviewed: 10/29/2016 Elsevier Patient Education  2020 Else[employer] Inc. Chlamydia, Female  Chlamydia is a STD (sexually transmitted disease). This is an infection that spreads through sexual contact. If it is not treated, it can cause serious problems. It must be treated with antibiotic medicine. If this infection is not treated and you are pregnant or become pregnant, your baby could get it during delivery. This may cause bad health problems for the baby. Sometimes, you may not have symptoms (asymptomatic). When you have symptoms, they can include:  Burning when you pee (urinate).  Peeing often.  Fluid (discharge) coming from the vagina.  Redness, soreness, and swelling (inflammation) of the butt (rectum).  Bleeding or fluid coming from the butt.  Belly (abdominal) pain.  Pain during sex.  Bleeding between periods.  Itching, burning, or redness in the eyes.  Fluid coming from the eyes. Follow these instructions at home: Medicines  Take over-the-counter and prescription medicines only as told by your doctor.  Take your antibiotic medicine as told by your doctor. Do not stop taking the antibiotic even if you start to  feel better. Sexual activity  Tell sex partners about your infection. Sex partners are people you had oral, anal, or vaginal sex with within 60 days of when you started getting sick. They need treatment, too.  Do not have sex until: ? You and your sex partners have been treated. ? Your doctor says it is okay.  If you have a single dose treatment, wait 7 days before having sex. General instructions  It is up to you to get your test results. Ask your doctor when your results will be ready.  Get a lot of rest.  Eat healthy foods.  Drink enough fluid to keep your pee (urine) clear or pale yellow.  Keep all follow-up visits as told by your doctor. You may need tests after 3 months. Preventing chlamydia  The  only way to prevent chlamydia is not to have sex. To lower your risk: ? Use latex condoms correctly. Do this every time you have sex. ? Avoid having many sex partners. ? Ask if your partner has been tested for STDs and if he or she had negative results. Contact a doctor if:  You get new symptoms.  You do not get better with treatment.  You have a fever or chills.  You have pain during sex. Get help right away if:  Your pain gets worse and does not get better with medicine.  You get flu-like symptoms, such as: ? Night sweats. ? Sore throat. ? Muscle aches.  You feel sick to your stomach (nauseous).  You throw up (vomit).  You have trouble swallowing.  You have bleeding: ? Between periods. ? After sex.  You have irregular periods.  You have belly pain that does not get better with medicine.  You have lower back pain that does not get better with medicine.  You feel weak or dizzy.  You pass out (faint).  You are pregnant and you get symptoms of chlamydia. Summary  Chlamydia is an infection that spreads through sexual contact.  Sometimes, chlamydia can cause no symptoms (asymptomatic).  Do not have sex until your doctor says it is okay.  All sex partners will have to be treated for chlamydia. This information is not intended to replace advice given to you by your health care provider. Make sure you discuss any questions you have with your health care provider. Document Released: 09/07/2008 Document Revised: 05/23/2018 Document Reviewed: 11/18/2016 Elsevier Patient Education  2020 [employer].

## 2019-08-28 NOTE — Progress Notes (Signed)
23 year old S WF G0 presents for test ofo cure gonorrhea/chlamydia.  08/07/2019 infection was asymptomatic found at annual exam, HIV, RPR were negative.  Treated with Rocephin and 1 g of Zithromax.  Ex partner was informed, has abstained.  Light monthly cycle on Yaz.  Denies vaginal discharge, urinary symptoms, abdominal pain or fever.  [employer] senior.  Exam: Appears well.  Abdomen soft, nontender, external genitalia within normal limits, speculum exam no visible discharge or erythema, GC/chlamydia culture taken.  No pain with exam.  Test of cure GC/chlamydia.  Plan: GC/chlamydia culture pending will triage based on results.  Reviewed importance of condoms until permanent partner.

## 2019-08-29 LAB — C. TRACHOMATIS/N. GONORRHOEAE RNA
C. trachomatis RNA, TMA: NOT DETECTED
N. gonorrhoeae RNA, TMA: NOT DETECTED

## 2019-08-31 ENCOUNTER — Telehealth: Payer: Self-pay

## 2019-08-31 MED ORDER — FLUCONAZOLE 150 MG PO TABS: ORAL_TABLET | ORAL | 0 refills | Status: DC

## 2019-08-31 NOTE — Telephone Encounter (Signed)
I have not had time to send it yet. It just came out to me. Unless you would like something different I will be calling patient and sending soon.

## 2019-08-31 NOTE — Telephone Encounter (Signed)
Agree with Fluconazole 150 mg 1 tab PO daily x 3.  No refill.  If persists will need to come in.

## 2019-08-31 NOTE — Telephone Encounter (Signed)
Spoke with patient and informed her and reviewed medication instruction. Rx sent.

## 2019-08-31 NOTE — Telephone Encounter (Addendum)
[place] Patient. Was in on Tuesday.  Last night started having a lot of irritation and itching vaginally. Itching continues today. Patient asked if you could send Rx?

## 2019-08-31 NOTE — Telephone Encounter (Signed)
Kylie Smith, I sent you a couple of messages this morning. As afternoon approached and you had not answered I routed to Dr. Dellis Filbert since not sure if you would be online. That is why she had a couple of yours. Thanks for looking at it. Just trying to make sure patient got treated before weekend.

## 2019-08-31 NOTE — Telephone Encounter (Signed)
Ok this was sent in

## 2019-08-31 NOTE — Telephone Encounter (Signed)
No that is fine, was not sure why it came to me? Thanks for handling

## 2019-09-03 ENCOUNTER — Encounter: Payer: Self-pay | Admitting: Gynecology

## 2019-09-17 ENCOUNTER — Other Ambulatory Visit: Payer: Self-pay

## 2019-09-18 ENCOUNTER — Encounter: Payer: Self-pay | Admitting: Women's Health

## 2019-09-18 ENCOUNTER — Ambulatory Visit: Payer: Federal, State, Local not specified - PPO | Admitting: Women's Health

## 2019-09-18 VITALS — BP 120/82

## 2019-09-18 DIAGNOSIS — Z113 Encounter for screening for infections with a predominantly sexual mode of transmission: Secondary | ICD-10-CM | POA: Diagnosis not present
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
## 2019-09-20 LAB — C. TRACHOMATIS/N. GONORRHOEAE RNA
C. trachomatis RNA, TMA: NOT DETECTED
N. gonorrhoeae RNA, TMA: NOT DETECTED

## 2019-12-31 ENCOUNTER — Encounter: Payer: Self-pay | Admitting: Women's Health

## 2019-12-31 ENCOUNTER — Other Ambulatory Visit: Payer: Self-pay

## 2019-12-31 ENCOUNTER — Ambulatory Visit: Payer: Federal, State, Local not specified - PPO | Admitting: Women's Health

## 2019-12-31 VITALS — BP 122/80

## 2019-12-31 DIAGNOSIS — Z113 Encounter for screening for infections with a predominantly sexual mode of transmission: Secondary | ICD-10-CM

## 2019-12-31 DIAGNOSIS — Z23 Encounter for immunization: Secondary | ICD-10-CM

## 2019-12-31 NOTE — Progress Notes (Signed)
24 yo SWF G0 presents for GC/chlamydia screen. Unprotected intercourse with new partner.  LMP 12/28/19, light cycle on Yaz. Chlamydia 08/07/19 with negative test of cure 08/28/2019. Gardasil second dose 08/07/2019. Denies vaginal burning, itching, odor, abdominal pain, or fever. [employer] senior.  No known health problems.  Exam: appears well. External genitalia within normal limits. Pelvic exam scant menses. Bimanual no adnexal tenderness, no CMT. GC/chlamydia culture taken.  STD screen  Light cycle on Yaz  Plan: encouraged condom use until permanent partner. Gardisal 3rd dose today. GC/Chlamydia pending, declined HIV, RPR.Marland Kitchen

## 2020-01-01 LAB — GC PROBE AMP THINPREP: N. gonorrhoeae RNA, TMA: NOT DETECTED

## 2020-01-01 LAB — CHLAMYDIA PROBE AMP THINPREP: C. trachomatis RNA, TMA: NOT DETECTED

## 2020-01-07 ENCOUNTER — Ambulatory Visit: Payer: Federal, State, Local not specified - PPO | Attending: Internal Medicine

## 2020-01-07 DIAGNOSIS — Z20822 Contact with and (suspected) exposure to covid-19: Secondary | ICD-10-CM

## 2020-01-08 LAB — NOVEL CORONAVIRUS, NAA: SARS-CoV-2, NAA: NOT DETECTED

## 2020-03-10 ENCOUNTER — Other Ambulatory Visit: Payer: Self-pay

## 2020-03-10 ENCOUNTER — Ambulatory Visit (HOSPITAL_COMMUNITY)
Admission: EM | Admit: 2020-03-10 | Discharge: 2020-03-10 | Disposition: A | Discharge status: Home / Self Care | Payer: Federal, State, Local not specified - PPO | Attending: Family Medicine | Admitting: Family Medicine

## 2020-03-10 ENCOUNTER — Encounter (HOSPITAL_COMMUNITY): Payer: Self-pay

## 2020-03-10 DIAGNOSIS — H6693 Otitis media, unspecified, bilateral: Secondary | ICD-10-CM

## 2020-03-10 DIAGNOSIS — Z760 Encounter for issue of repeat prescription: Secondary | ICD-10-CM

## 2020-03-10 MED ORDER — AMOXICILLIN 875 MG PO TABS: 875.0000 mg | ORAL_TABLET | Freq: Two times a day (BID) | ORAL | 0 refills | Status: AC

## 2020-03-10 MED ORDER — FLUCONAZOLE 150 MG PO TABS: ORAL_TABLET | ORAL | 0 refills | Status: DC

## 2020-03-10 MED ORDER — IBUPROFEN 600 MG PO TABS: 600.0000 mg | ORAL_TABLET | Freq: Four times a day (QID) | ORAL | 0 refills | Status: DC | PRN

## 2020-03-10 MED ORDER — LISDEXAMFETAMINE DIMESYLATE 30 MG PO CAPS: 30.0000 mg | ORAL_CAPSULE | Freq: Every day | ORAL | 0 refills | Status: DC
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
## 2020-03-10 NOTE — ED Triage Notes (Signed)
Pt reports having left ear pain and left sided facial pain x 2 days. Advil helps somewhat with the pain.

## 2020-03-10 NOTE — Discharge Instructions (Addendum)
You have a double ear infection. I have sent in amoxicillin for you to take twice a day for 7 days.  Take all of the medication even if you are feeling better.  If you are not feeling better within the next 2 days, follow up with this office or primary care as needed.

## 2020-03-26 ENCOUNTER — Other Ambulatory Visit: Payer: Self-pay

## 2020-03-27 ENCOUNTER — Encounter: Payer: Federal, State, Local not specified - PPO | Admitting: Nurse Practitioner

## 2020-03-31 ENCOUNTER — Other Ambulatory Visit: Payer: Self-pay

## 2020-03-31 ENCOUNTER — Encounter: Payer: Federal, State, Local not specified - PPO | Admitting: Nurse Practitioner

## 2020-03-31 ENCOUNTER — Ambulatory Visit (HOSPITAL_COMMUNITY)
Admission: EM | Admit: 2020-03-31 | Discharge: 2020-03-31 | Disposition: A | Discharge status: Home / Self Care | Payer: Federal, State, Local not specified - PPO | Attending: Family Medicine | Admitting: Family Medicine

## 2020-03-31 DIAGNOSIS — R432 Parageusia: Secondary | ICD-10-CM

## 2020-03-31 DIAGNOSIS — Z20822 Contact with and (suspected) exposure to covid-19: Secondary | ICD-10-CM | POA: Diagnosis not present

## 2020-03-31 DIAGNOSIS — U071 COVID-19: Secondary | ICD-10-CM | POA: Diagnosis not present
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
## 2020-03-31 NOTE — Discharge Instructions (Signed)
We will call if positive  Please follow up if your symptoms change.

## 2020-03-31 NOTE — ED Triage Notes (Signed)
Pt requesting COVID testing after not being able to taste dinner tonight.

## 2020-04-01 ENCOUNTER — Telehealth (HOSPITAL_COMMUNITY): Payer: Self-pay

## 2020-04-01 LAB — SARS CORONAVIRUS 2 (TAT 6-24 HRS): SARS Coronavirus 2: POSITIVE — AB

## 2020-05-20 ENCOUNTER — Other Ambulatory Visit: Payer: Self-pay

## 2020-05-21 ENCOUNTER — Ambulatory Visit: Payer: Federal, State, Local not specified - PPO | Admitting: Nurse Practitioner

## 2020-05-22 ENCOUNTER — Ambulatory Visit: Payer: Federal, State, Local not specified - PPO | Admitting: Nurse Practitioner

## 2020-06-17 ENCOUNTER — Ambulatory Visit: Payer: Federal, State, Local not specified - PPO | Admitting: Nurse Practitioner

## 2020-06-17 ENCOUNTER — Encounter: Payer: Self-pay | Admitting: Nurse Practitioner

## 2020-06-17 ENCOUNTER — Other Ambulatory Visit: Payer: Self-pay

## 2020-06-17 VITALS — BP 118/70

## 2020-06-17 DIAGNOSIS — B373 Candidiasis of vulva and vagina: Secondary | ICD-10-CM | POA: Diagnosis not present

## 2020-06-17 DIAGNOSIS — Z113 Encounter for screening for infections with a predominantly sexual mode of transmission: Secondary | ICD-10-CM

## 2020-06-17 DIAGNOSIS — Z7251 High risk heterosexual behavior: Secondary | ICD-10-CM | POA: Diagnosis not present

## 2020-06-17 DIAGNOSIS — B3731 Acute candidiasis of vulva and vagina: Secondary | ICD-10-CM

## 2020-06-17 DIAGNOSIS — N898 Other specified noninflammatory disorders of vagina: Secondary | ICD-10-CM

## 2020-06-17 MED ORDER — FLUCONAZOLE 150 MG PO TABS: 150.0000 mg | ORAL_TABLET | Freq: Once | ORAL | 0 refills | Status: AC

## 2020-06-17 NOTE — Patient Instructions (Signed)

## 2020-06-17 NOTE — Progress Notes (Signed)
   Acute Office Visit  Subjective:    Patient ID: Railynn Ballo, female    DOB: [birth year], 24 y.o.   MRN: [id number]  Chief Complaint  Patient presents with  . STD check  . Irregular periods on OC's    HPI 24 year old G0 presents today for STD testing. She had unprotected intercourse with multiple female partners. History of Chlamydia 2018/2020.  Also complains of some irregular bleeding on OCPs. She admits to missing pills and bleeding a few days afterwards. Denies vaginal and urinary symptoms.    Review of Systems  Constitutional: Negative.   Gastrointestinal: Negative.   Genitourinary: Positive for menstrual problem (breakthrough bleeding). Negative for frequency, genital sores, urgency and vaginal pain.       Objective:    Physical Exam Constitutional:      Appearance: Normal appearance.  Abdominal:     General: Abdomen is flat.     Palpations: Abdomen is soft.  Genitourinary:    General: Normal vulva.     Vagina: Vaginal discharge (copious, white, mucous) present.     Cervix: Normal.     Uterus: Normal.      BP 118/70   LMP 06/13/2020  Wt Readings from Last 3 Encounters:  08/07/19 147 lb (66.7 kg)  02/26/19 139 lb 15.9 oz (63.5 kg)  02/22/19 140 lb (63.5 kg)   Wet prep: + yeast/hyphae     Assessment & Plan:   Problem List Items Addressed This Visit    None    Visit Diagnoses    Screen for STD (sexually transmitted disease)    -  Primary   Relevant Orders   C. trachomatis/N. gonorrhoeae RNA   RPR   HIV Antibody (routine testing w rflx)   Unprotected sexual intercourse       Relevant Orders   C. trachomatis/N. gonorrhoeae RNA   RPR   HIV Antibody (routine testing w rflx)   Vaginal discharge       Relevant Orders   WET PREP FOR TRICH, YEAST, CLUE   Vaginal candidiasis       Relevant Medications   fluconazole (DIFLUCAN) 150 MG tablet      Plan: Diflucan 150 mg x 1 dose for vaginal yeast.  Chlamydia/gonorrhea, RPR, HIV. Discussed safe sex  practices and condom use until permanent partner.  Educated on importance of taking OCPs daily at the same time, missed doses most likely the cause of breakthrough bleeding.  Discussed options of continuing OCPs with consistent use or use of alternative contraception.  She would like to try to be consistent with OCPs but will consider other options.  Follow-up as needed.    Olivia Mackie [facility], 11:35 AM 06/17/2020

## 2020-06-18 LAB — WET PREP FOR TRICH, YEAST, CLUE

## 2020-06-18 LAB — RPR: RPR Ser Ql: NONREACTIVE

## 2020-06-18 LAB — C. TRACHOMATIS/N. GONORRHOEAE RNA
C. trachomatis RNA, TMA: NOT DETECTED
N. gonorrhoeae RNA, TMA: NOT DETECTED

## 2020-06-18 LAB — HIV ANTIBODY (ROUTINE TESTING W REFLEX): HIV 1&2 Ab, 4th Generation: NONREACTIVE

## 2020-07-14 ENCOUNTER — Telehealth: Payer: Self-pay | Admitting: *Deleted

## 2020-07-14 NOTE — Telephone Encounter (Signed)
You are back up MD) Patient called c/o painful cold sores, asked if a Rx for acyclovir could be sent to pharmacy? This would be a new Rx for patient, her mother told her about possible Rx for acyclovir to help with this. Please advise

## 2020-07-15 ENCOUNTER — Other Ambulatory Visit: Payer: Federal, State, Local not specified - PPO

## 2020-07-15 MED ORDER — VALACYCLOVIR HCL 1 G PO TABS: ORAL_TABLET | ORAL | 1 refills | Status: DC

## 2020-07-15 NOTE — Telephone Encounter (Signed)
Agree with Valacyclovir 1 g PO daily x 5 days.  #30 tab, refill x 1.

## 2020-07-15 NOTE — Telephone Encounter (Signed)
Patient informed with below note. Rx sent.  

## 2020-08-05 ENCOUNTER — Ambulatory Visit: Payer: Federal, State, Local not specified - PPO | Admitting: Nurse Practitioner

## 2020-08-05 ENCOUNTER — Other Ambulatory Visit: Payer: Self-pay

## 2020-08-05 ENCOUNTER — Encounter: Payer: Self-pay | Admitting: Nurse Practitioner

## 2020-08-05 VITALS — BP 122/78

## 2020-08-05 DIAGNOSIS — N898 Other specified noninflammatory disorders of vagina: Secondary | ICD-10-CM

## 2020-08-05 DIAGNOSIS — Z113 Encounter for screening for infections with a predominantly sexual mode of transmission: Secondary | ICD-10-CM | POA: Diagnosis not present

## 2020-08-05 DIAGNOSIS — B9689 Other specified bacterial agents as the cause of diseases classified elsewhere: Secondary | ICD-10-CM | POA: Diagnosis not present

## 2020-08-05 DIAGNOSIS — Z3041 Encounter for surveillance of contraceptive pills: Secondary | ICD-10-CM

## 2020-08-05 DIAGNOSIS — N760 Acute vaginitis: Secondary | ICD-10-CM | POA: Diagnosis not present

## 2020-08-05 LAB — WET PREP FOR TRICH, YEAST, CLUE

## 2020-08-05 MED ORDER — METRONIDAZOLE 500 MG PO TABS: 500.0000 mg | ORAL_TABLET | Freq: Two times a day (BID) | ORAL | 0 refills | Status: DC

## 2020-08-05 MED ORDER — DROSPIRENONE-ETHINYL ESTRADIOL 3-0.02 MG PO TABS: 1.0000 | ORAL_TABLET | Freq: Every day | ORAL | 4 refills | Status: DC

## 2020-08-05 NOTE — Progress Notes (Signed)
   Acute Office Visit  Subjective:    Patient ID: Kylie Smith, female    DOB: [birth year], 24 y.o.   MRN: [id number]   HPI 24 y.o. G0 presents today for vaginal odor and STD testing. Condoms used consistently since previous visit. History of chlamydia 2018/2020. Denies vaginal itching or urinary symptoms. OCPs, requesting refill.    Review of Systems  Constitutional: Negative.   Genitourinary:       Odor       Objective:    Physical Exam Constitutional:      Appearance: Normal appearance.  Genitourinary:    General: Normal vulva.     Vagina: Vaginal discharge (white, copious) present.     Cervix: Normal.     Uterus: Normal.      BP 122/78 (BP Location: Right Arm, Patient Position: Sitting, Cuff Size: Normal)   LMP 07/20/2020  Wt Readings from Last 3 Encounters:  08/07/19 147 lb (66.7 kg)  02/26/19 139 lb 15.9 oz (63.5 kg)  02/22/19 140 lb (63.5 kg)   Wet prep + clue cells     Assessment & Plan:   Problem List Items Addressed This Visit    None    Visit Diagnoses    Screen for STD (sexually transmitted disease)    -  Primary   Relevant Orders   C. trachomatis/N. gonorrhoeae RNA   RPR   HIV Antibody (routine testing w rflx)   Vaginal odor       Relevant Orders   WET PREP FOR TRICH, YEAST, CLUE   Encounter for surveillance of contraceptive pills       Relevant Medications   drospirenone-ethinyl estradiol (LORYNA) 3-0.02 MG tablet   Bacterial vaginosis       Relevant Medications   metroNIDAZOLE (FLAGYL) 500 MG tablet     Plan: Wet prep and exam consistent with bacterial vaginosis.  Flagyl 500 mg twice a day for 7 days.  Instructed to complete full course of antibiotic, take with food and avoid alcohol.  Chlamydia/gonorrhea, HIV, RPR today.  Refill of birth control x1 year.  Will return to office if symptoms worsen or do not improve.     Olivia Mackie [facility], 4:02 PM 08/05/2020

## 2020-08-05 NOTE — Patient Instructions (Addendum)
Safe Sex Practicing safe sex means taking steps before and during sex to reduce your risk of:  Getting an STI (sexually transmitted infection).  Giving your partner an STI.  Unwanted or unplanned pregnancy. How can I practice safe sex?     Ways you can practice safe sex  Limit your sexual partners to only one partner who is having sex with only you.  Avoid using alcohol and drugs before having sex. Alcohol and drugs can affect your judgment.  Before having sex with a new partner: ? Talk to your partner about past partners, past STIs, and drug use. ? Get screened for STIs and discuss the results with your partner. Ask your partner to get screened, too.  Check your body regularly for sores, blisters, rashes, or unusual discharge. If you notice any of these problems, visit your health care provider.  Avoid sexual contact if you have symptoms of an infection or you are being treated for an STI.  While having sex, use a condom. Make sure to: ? Use a condom every time you have vaginal, oral, or anal sex. Both females and males should wear condoms during oral sex. ? Keep condoms in place from the beginning to the end of sexual activity. ? Use a latex condom, if possible. Latex condoms offer the best protection. ? Use only water-based lubricants with a condom. Using petroleum-based lubricants or oils will weaken the condom and increase the chance that it will break. Ways your health care provider can help you practice safe sex  See your health care provider for regular screenings, exams, and tests for STIs.  Talk with your health care provider about what kind of birth control (contraception) is best for you.  Get vaccinated against hepatitis B and human papillomavirus (HPV).  If you are at risk of being infected with HIV (human immunodeficiency virus), talk with your health care provider about taking a prescription medicine to prevent HIV infection. You are at risk for HIV if  you: ? Are a man who has sex with other men. ? Are sexually active with more than one partner. ? Take drugs by injection. ? Have a sex partner who has HIV. ? Have unprotected sex. ? Have sex with someone who has sex with both men and women. ? Have had an STI. Follow these instructions at home:  Take over-the-counter and prescription medicines as told by your health care provider.  Keep all follow-up visits as told by your health care provider. This is important. Where to find more information  Centers for Disease Control and Prevention: https://www.cdc.gov/std/prevention/default.htm  Planned Parenthood: https://www.plannedparenthood.org/  Office on Women's Health: https://www.womenshealth.gov/a-z-topics/sexually-transmitted-infections Summary  Practicing safe sex means taking steps before and during sex to reduce your risk of STIs, giving your partner STIs, and having an unwanted or unplanned pregnancy.  Before having sex with a new partner, talk to your partner about past partners, past STIs, and drug use.  Use a condom every time you have vaginal, oral, or anal sex. Both females and males should wear condoms during oral sex.  Check your body regularly for sores, blisters, rashes, or unusual discharge. If you notice any of these problems, visit your health care provider.  See your health care provider for regular screenings, exams, and tests for STIs. This information is not intended to replace advice given to you by your health care provider. Make sure you discuss any questions you have with your health care provider. Document Revised: 03/23/2019 Document Reviewed: 09/11/2018 Elsevier Patient Education    2020 Elsevier Inc. Bacterial Vaginosis  Bacterial vaginosis is a vaginal infection that occurs when the normal balance of bacteria in the vagina is disrupted. It results from an overgrowth of certain bacteria. This is the most common vaginal infection among women ages  15-44. Because bacterial vaginosis increases your risk for STIs (sexually transmitted infections), getting treated can help reduce your risk for chlamydia, gonorrhea, herpes, and HIV (human immunodeficiency virus). Treatment is also important for preventing complications in pregnant women, because this condition can cause an early (premature) delivery. What are the causes? This condition is caused by an increase in harmful bacteria that are normally present in small amounts in the vagina. However, the reason that the condition develops is not fully understood. What increases the risk? The following factors may make you more likely to develop this condition:  Having a new sexual partner or multiple sexual partners.  Having unprotected sex.  Douching.  Having an intrauterine device (IUD).  Smoking.  Drug and alcohol abuse.  Taking certain antibiotic medicines.  Being pregnant. You cannot get bacterial vaginosis from toilet seats, bedding, swimming pools, or contact with objects around you. What are the signs or symptoms? Symptoms of this condition include:  Grey or white vaginal discharge. The discharge can also be watery or foamy.  A fish-like odor with discharge, especially after sexual intercourse or during menstruation.  Itching in and around the vagina.  Burning or pain with urination. Some women with bacterial vaginosis have no signs or symptoms. How is this diagnosed? This condition is diagnosed based on:  Your medical history.  A physical exam of the vagina.  Testing a sample of vaginal fluid under a microscope to look for a large amount of bad bacteria or abnormal cells. Your health care provider may use a cotton swab or a small wooden spatula to collect the sample. How is this treated? This condition is treated with antibiotics. These may be given as a pill, a vaginal cream, or a medicine that is put into the vagina (suppository). If the condition comes back after  treatment, a second round of antibiotics may be needed. Follow these instructions at home: Medicines  Take over-the-counter and prescription medicines only as told by your health care provider.  Take or use your antibiotic as told by your health care provider. Do not stop taking or using the antibiotic even if you start to feel better. General instructions  If you have a female sexual partner, tell her that you have a vaginal infection. She should see her health care provider and be treated if she has symptoms. If you have a female sexual partner, he does not need treatment.  During treatment: ? Avoid sexual activity until you finish treatment. ? Do not douche. ? Avoid alcohol as directed by your health care provider. ? Avoid breastfeeding as directed by your health care provider.  Drink enough water and fluids to keep your urine clear or pale yellow.  Keep the area around your vagina and rectum clean. ? Wash the area daily with warm water. ? Wipe yourself from front to back after using the toilet.  Keep all follow-up visits as told by your health care provider. This is important. How is this prevented?  Do not douche.  Wash the outside of your vagina with warm water only.  Use protection when having sex. This includes latex condoms and dental dams.  Limit how many sexual partners you have. To help prevent bacterial vaginosis, it is best to have sex   with just one partner (monogamous).  Make sure you and your sexual partner are tested for STIs.  Wear cotton or cotton-lined underwear.  Avoid wearing tight pants and pantyhose, especially during summer.  Limit the amount of alcohol that you drink.  Do not use any products that contain nicotine or tobacco, such as cigarettes and e-cigarettes. If you need help quitting, ask your health care provider.  Do not use illegal drugs. Where to find more information  Centers for Disease Control and Prevention:  www.cdc.gov/std  American Sexual Health Association (ASHA): www.ashastd.org  U.S. Department of Health and Human Services, Office on Women's Health: www.womenshealth.gov/ or https://www.womenshealth.gov/a-z-topics/bacterial-vaginosis Contact a health care provider if:  Your symptoms do not improve, even after treatment.  You have more discharge or pain when urinating.  You have a fever.  You have pain in your abdomen.  You have pain during sex.  You have vaginal bleeding between periods. Summary  Bacterial vaginosis is a vaginal infection that occurs when the normal balance of bacteria in the vagina is disrupted.  Because bacterial vaginosis increases your risk for STIs (sexually transmitted infections), getting treated can help reduce your risk for chlamydia, gonorrhea, herpes, and HIV (human immunodeficiency virus). Treatment is also important for preventing complications in pregnant women, because the condition can cause an early (premature) delivery.  This condition is treated with antibiotic medicines. These may be given as a pill, a vaginal cream, or a medicine that is put into the vagina (suppository). This information is not intended to replace advice given to you by your health care provider. Make sure you discuss any questions you have with your health care provider. Document Revised: 11/11/2017 Document Reviewed: 08/14/2016 Elsevier Patient Education  2020 Elsevier Inc.  

## 2020-08-06 LAB — HIV ANTIBODY (ROUTINE TESTING W REFLEX): HIV 1&2 Ab, 4th Generation: NONREACTIVE

## 2020-08-06 LAB — RPR: RPR Ser Ql: NONREACTIVE

## 2020-08-07 LAB — C. TRACHOMATIS/N. GONORRHOEAE RNA
C. trachomatis RNA, TMA: NOT DETECTED
N. gonorrhoeae RNA, TMA: NOT DETECTED

## 2020-10-15 ENCOUNTER — Encounter: Payer: Self-pay | Admitting: Podiatry

## 2020-10-15 ENCOUNTER — Other Ambulatory Visit: Payer: Self-pay

## 2020-10-15 ENCOUNTER — Ambulatory Visit: Payer: Federal, State, Local not specified - PPO | Admitting: Podiatry

## 2020-10-15 ENCOUNTER — Ambulatory Visit (INDEPENDENT_AMBULATORY_CARE_PROVIDER_SITE_OTHER): Payer: Federal, State, Local not specified - PPO

## 2020-10-15 DIAGNOSIS — M7750 Other enthesopathy of unspecified foot: Secondary | ICD-10-CM

## 2020-10-15 DIAGNOSIS — L600 Ingrowing nail: Secondary | ICD-10-CM

## 2020-10-15 DIAGNOSIS — M7751 Other enthesopathy of right foot: Secondary | ICD-10-CM

## 2020-10-15 NOTE — Patient Instructions (Signed)

## 2020-10-16 ENCOUNTER — Encounter: Payer: Self-pay | Admitting: Podiatry

## 2020-10-16 NOTE — Progress Notes (Signed)
Subjective:  Patient ID: Kylie Smith, female    DOB: 07-26-1996,  MRN: [id number]  Chief Complaint  Patient presents with  . Ingrown Toenail     (np) right great toenail;ingrown infection  . Tendonitis    possible rupture    24 y.o. female presents with the above complaint.  Patient presents with complaint of right medial border ingrown.  Patient states is painful to touch.  She states that the ingrown has been hurting for quite some time.  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  She started noticing a lot of pain to the medial border.  She has not tried anything for it.  She denies any other acute complaints.  She would like to have it removed.   Review of Systems: Negative except as noted in the HPI. Denies N/V/F/Ch.  History reviewed. No pertinent past medical history.  Current Outpatient Medications:  .  drospirenone-ethinyl estradiol (LORYNA) 3-0.02 MG tablet, Take 1 tablet by mouth daily., Disp: 84 tablet, Rfl: 4 .  ibuprofen (ADVIL) 600 MG tablet, Take 1 tablet (600 mg total) by mouth every 6 (six) hours as needed., Disp: 30 tablet, Rfl: 0 .  ibuprofen (ADVIL) 800 MG tablet, Take 800 mg by mouth 3 (three) times daily., Disp: , Rfl:  .  lisdexamfetamine (VYVANSE) 30 MG capsule, Take 1 capsule (30 mg total) by mouth daily., Disp: 30 capsule, Rfl: 0 .  metroNIDAZOLE (FLAGYL) 500 MG tablet, Take 1 tablet (500 mg total) by mouth 2 (two) times daily., Disp: 14 tablet, Rfl: 0 .  valACYclovir (VALTREX) 1000 MG tablet, Take 1 tablet by mouth daily for 5 day for outbreak., Disp: 30 tablet, Rfl: 1  Social History   Tobacco Use  Smoking Status Never Smoker  Smokeless Tobacco Never Used    No Known Allergies Objective:  There were no vitals filed for this visit. There is no height or weight on file to calculate BMI. Constitutional Well developed. Well nourished.  Vascular Dorsalis pedis pulses palpable bilaterally. Posterior tibial pulses palpable  bilaterally. Capillary refill normal to all digits.  No cyanosis or clubbing noted. Pedal hair growth normal.  Neurologic Normal speech. Oriented to person, place, and time. Epicritic sensation to light touch grossly present bilaterally.  Dermatologic Painful ingrowing nail at medial nail borders of the hallux nail right. No other open wounds. No skin lesions.  Orthopedic: Normal joint ROM without pain or crepitus bilaterally. No visible deformities. No bony tenderness.   Radiographs: None Assessment:   1. Ingrown right big toenail    Plan:  Patient was evaluated and treated and all questions answered.  Ingrown Nail, right -Patient elects to proceed with minor surgery to remove ingrown toenail removal today. Consent reviewed and signed by patient. -Ingrown nail excised. See procedure note. -Educated on post-procedure care including soaking. Written instructions provided and reviewed. -Patient to follow up in 2 weeks for nail check.  Procedure: Excision of Ingrown Toenail Location: Right 1st toe medial nail borders. Anesthesia: Lidocaine 1% plain; 1.5 mL and Marcaine 0.5% plain; 1.5 mL, digital block. Skin Prep: Betadine. Dressing: Silvadene; telfa; dry, sterile, compression dressing. Technique: Following skin prep, the toe was exsanguinated and a tourniquet was secured at the base of the toe. The affected nail border was freed, split with a nail splitter, and excised. Chemical matrixectomy was then performed with phenol and irrigated out with alcohol. The tourniquet was then removed and sterile dressing applied. Disposition: Patient tolerated procedure well. Patient to return in 2 weeks  for follow-up.   Return in about 2 weeks (around 10/29/2020) for right nail check.

## 2020-10-29 ENCOUNTER — Ambulatory Visit: Payer: Federal, State, Local not specified - PPO | Admitting: Podiatry

## 2021-01-10 ENCOUNTER — Encounter (HOSPITAL_COMMUNITY): Payer: Self-pay | Admitting: Emergency Medicine

## 2021-01-10 ENCOUNTER — Other Ambulatory Visit: Payer: Self-pay

## 2021-01-10 ENCOUNTER — Inpatient Hospital Stay (HOSPITAL_COMMUNITY): Payer: Federal, State, Local not specified - PPO

## 2021-01-10 ENCOUNTER — Inpatient Hospital Stay (HOSPITAL_COMMUNITY)
Admission: AD | Admit: 2021-01-10 | Discharge: 2021-01-10 | Disposition: A | Discharge status: Home / Self Care | Payer: Federal, State, Local not specified - PPO | Attending: Obstetrics and Gynecology | Admitting: Obstetrics and Gynecology

## 2021-01-10 DIAGNOSIS — O0489 (Induced) termination of pregnancy with other complications: Secondary | ICD-10-CM | POA: Diagnosis not present

## 2021-01-10 DIAGNOSIS — O034 Incomplete spontaneous abortion without complication: Secondary | ICD-10-CM | POA: Insufficient documentation

## 2021-01-10 DIAGNOSIS — Z793 Long term (current) use of hormonal contraceptives: Secondary | ICD-10-CM | POA: Insufficient documentation

## 2021-01-10 DIAGNOSIS — Z79899 Other long term (current) drug therapy: Secondary | ICD-10-CM | POA: Insufficient documentation

## 2021-01-10 DIAGNOSIS — O209 Hemorrhage in early pregnancy, unspecified: Secondary | ICD-10-CM

## 2021-01-10 DIAGNOSIS — O046 Delayed or excessive hemorrhage following (induced) termination of pregnancy: Secondary | ICD-10-CM | POA: Diagnosis not present

## 2021-01-10 DIAGNOSIS — Z791 Long term (current) use of non-steroidal anti-inflammatories (NSAID): Secondary | ICD-10-CM | POA: Diagnosis not present

## 2021-01-10 DIAGNOSIS — O074 Failed attempted termination of pregnancy without complication: Secondary | ICD-10-CM

## 2021-01-10 LAB — CBC
HCT: 34.9 % — ABNORMAL LOW (ref 36.0–46.0)
Hemoglobin: 11.2 g/dL — ABNORMAL LOW (ref 12.0–15.0)
MCH: 28.6 pg (ref 26.0–34.0)
MCHC: 32.1 g/dL (ref 30.0–36.0)
MCV: 89 fL (ref 80.0–100.0)
Platelets: 302 10*3/uL (ref 150–400)
RBC: 3.92 MIL/uL (ref 3.87–5.11)
RDW: 13.6 % (ref 11.5–15.5)
WBC: 6.4 10*3/uL (ref 4.0–10.5)
nRBC: 0 % (ref 0.0–0.2)

## 2021-01-10 LAB — URINALYSIS, ROUTINE W REFLEX MICROSCOPIC
Bilirubin Urine: NEGATIVE
Glucose, UA: NEGATIVE mg/dL
Ketones, ur: NEGATIVE mg/dL
Nitrite: NEGATIVE
Protein, ur: 30 mg/dL — AB
Specific Gravity, Urine: 1.023 (ref 1.005–1.030)
pH: 6 (ref 5.0–8.0)

## 2021-01-10 LAB — HCG, QUANTITATIVE, PREGNANCY: hCG, Beta Chain, Quant, S: 2945 m[IU]/mL — ABNORMAL HIGH (ref <5)

## 2021-01-10 LAB — ABO/RH: ABO/RH(D): B POS

## 2021-01-10 LAB — POCT PREGNANCY, URINE: Preg Test, Ur: POSITIVE — AB

## 2021-01-10 MED ORDER — MISOPROSTOL 200 MCG PO TABS: 800.0000 ug | ORAL_TABLET | Freq: Once | ORAL | Status: DC

## 2021-01-10 MED ORDER — OXYCODONE-ACETAMINOPHEN 5-325 MG PO TABS: 2.0000 | ORAL_TABLET | ORAL | 0 refills | Status: DC | PRN

## 2021-01-10 MED ORDER — MISOPROSTOL 200 MCG PO TABS
800.0000 ug | ORAL_TABLET | Freq: Once | ORAL | Status: AC
  Administered 2021-01-10: 800 ug via BUCCAL
  Filled 2021-01-10: qty 4

## 2021-01-10 MED ORDER — IBUPROFEN 800 MG PO TABS: 800.0000 mg | ORAL_TABLET | Freq: Three times a day (TID) | ORAL | 2 refills | Status: AC

## 2021-01-10 MED ORDER — PROMETHAZINE HCL 25 MG PO TABS: 25.0000 mg | ORAL_TABLET | Freq: Four times a day (QID) | ORAL | 0 refills | Status: DC | PRN

## 2021-01-10 NOTE — MAU Provider Note (Signed)
History     CSN: [id number]  Arrival date and time: 01/10/21 1641   Event Date/Time   First Provider Initiated Contact with Patient 01/10/21 1901      Chief Complaint  Patient presents with  . Vaginal Bleeding  . syncopal episode   HPI Kylie Smith is a 25 y.o. G1P0000 at [redacted]w[redacted]d who presents from the ED for evaluation of vaginal bleeding. She reports she went to [facility] and got pills for a TAB. She states she had a big gush of bright red bleeding with clots that improved last week. This week, she reports the bleeding got worse and she has continued to have heavy bleeding and cramping. She was seen at [facility] yesterday and was told there is "something still in the uterus" but the doctor was not available to discuss results with patient.   OB History    Gravida  1   Para  0   Term  0   Preterm  0   AB  0   Living  0     SAB  0   IAB  0   Ectopic  0   Multiple  0   Live Births  0           History reviewed. No pertinent past medical history.  History reviewed. No pertinent surgical history.  Family History  Problem Relation Age of Onset  . Healthy Mother   . Healthy Father     Social History   Tobacco Use  . Smoking status: Never Smoker  . Smokeless tobacco: Never Used  Vaping Use  . Vaping Use: Never used  Substance Use Topics  . Alcohol use: Yes  . Drug use: No    Allergies: No Known Allergies  Medications Prior to Admission  Medication Sig Dispense Refill Last Dose  . drospirenone-ethinyl estradiol (LORYNA) 3-0.02 MG tablet Take 1 tablet by mouth daily. 84 tablet 4   . ibuprofen (ADVIL) 600 MG tablet Take 1 tablet (600 mg total) by mouth every 6 (six) hours as needed. 30 tablet 0   . ibuprofen (ADVIL) 800 MG tablet Take 800 mg by mouth 3 (three) times daily.     Marland Kitchen lisdexamfetamine (VYVANSE) 30 MG capsule Take 1 capsule (30 mg total) by mouth daily. 30 capsule 0   . metroNIDAZOLE (FLAGYL) 500 MG tablet Take 1 tablet (500 mg  total) by mouth 2 (two) times daily. 14 tablet 0   . valACYclovir (VALTREX) 1000 MG tablet Take 1 tablet by mouth daily for 5 day for outbreak. 30 tablet 1     Review of Systems  Constitutional: Negative.  Negative for fatigue and fever.  HENT: Negative.   Respiratory: Negative.  Negative for shortness of breath.   Cardiovascular: Negative.  Negative for chest pain.  Gastrointestinal: Positive for abdominal pain. Negative for constipation, diarrhea, nausea and vomiting.  Genitourinary: Positive for vaginal bleeding. Negative for dysuria and vaginal discharge.  Neurological: Negative.  Negative for dizziness and headaches.   Physical Exam   Blood pressure 131/69, pulse (!) 104, temperature 98.6 F (37 C), temperature source Oral, resp. rate 15, height 5\' 6"  (1.676 m), weight 68 kg, last menstrual period 10/29/2020, SpO2 100 %.  Physical Exam Vitals and nursing note reviewed.  Constitutional:      General: She is not in acute distress.    Appearance: She is well-developed and well-nourished.  HENT:     Head: Normocephalic.  Eyes:     Pupils: Pupils are equal,  round, and reactive to light.  Cardiovascular:     Rate and Rhythm: Normal rate and regular rhythm.     Heart sounds: Normal heart sounds.  Pulmonary:     Effort: Pulmonary effort is normal. No respiratory distress.     Breath sounds: Normal breath sounds.  Abdominal:     General: Bowel sounds are normal. There is no distension.     Palpations: Abdomen is soft.     Tenderness: There is no abdominal tenderness.  Genitourinary:    Comments: Small amount of bright red bleeding in vault Skin:    General: Skin is warm and dry.  Neurological:     Mental Status: She is alert and oriented to person, place, and time.  Psychiatric:        Mood and Affect: Mood and affect normal.        Behavior: Behavior normal.        Thought Content: Thought content normal.        Judgment: Judgment normal.     MAU Course   Procedures Results for orders placed or performed during the hospital encounter of 01/10/21 (from the past 24 hour(s))  Urinalysis, Routine w reflex microscopic Urine, Clean Catch     Status: Abnormal   Collection Time: 01/10/21  6:05 PM  Result Value Ref Range   Color, Urine YELLOW YELLOW   APPearance HAZY (A) CLEAR   Specific Gravity, Urine 1.023 1.005 - 1.030   pH 6.0 5.0 - 8.0   Glucose, UA NEGATIVE NEGATIVE mg/dL   Hgb urine dipstick LARGE (A) NEGATIVE   Bilirubin Urine NEGATIVE NEGATIVE   Ketones, ur NEGATIVE NEGATIVE mg/dL   Protein, ur 30 (A) NEGATIVE mg/dL   Nitrite NEGATIVE NEGATIVE   Leukocytes,Ua SMALL (A) NEGATIVE   RBC / HPF 11-20 0 - 5 RBC/hpf   WBC, UA 21-50 0 - 5 WBC/hpf   Bacteria, UA RARE (A) NONE SEEN   Squamous Epithelial / LPF 6-10 0 - 5   Mucus PRESENT   Pregnancy, urine POC     Status: Abnormal   Collection Time: 01/10/21  6:09 PM  Result Value Ref Range   Preg Test, Ur POSITIVE (A) NEGATIVE  CBC     Status: Abnormal   Collection Time: 01/10/21  6:37 PM  Result Value Ref Range   WBC 6.4 4.0 - 10.5 K/uL   RBC 3.92 3.87 - 5.11 MIL/uL   Hemoglobin 11.2 (L) 12.0 - 15.0 g/dL   HCT 70.1 (L) 77.9 - 39.0 %   MCV 89.0 80.0 - 100.0 fL   MCH 28.6 26.0 - 34.0 pg   MCHC 32.1 30.0 - 36.0 g/dL   RDW 30.0 92.3 - 30.0 %   Platelets 302 150 - 400 K/uL   nRBC 0.0 0.0 - 0.2 %  hCG, quantitative, pregnancy     Status: Abnormal   Collection Time: 01/10/21  6:37 PM  Result Value Ref Range   hCG, Beta Chain, Quant, S 2,945 (H) <5 mIU/mL  ABO/Rh     Status: None   Collection Time: 01/10/21  6:37 PM  Result Value Ref Range   ABO/RH(D) B POS    No rh immune globuloin      NOT A RH IMMUNE GLOBULIN CANDIDATE, PT RH POSITIVE Performed at [facility], [address] [place], [place] [address]    [place] Comp Less 14 Wks  Result Date: 01/10/2021 CLINICAL DATA:  Vaginal bleeding in first trimester of pregnancy, LMP 10/29/2020; had IAB  2 weeks ago, still with  prostate pregnancy test today EXAM: OBSTETRIC <14 WK ULTRASOUND TECHNIQUE: Transabdominal ultrasound was performed for evaluation of the gestation as well as the maternal uterus and adnexal regions. COMPARISON:  None FINDINGS: Intrauterine gestational sac: Absent Yolk sac:  N/A Embryo:  N/A Cardiac Activity: N/A Heart Rate: N/A bpm MSD:    mm    w     d CRL:     mm    w  d                  [place] EDC: Subchorionic hemorrhage:  N/A Maternal uterus/adnexae: Uterus anteverted without mass or gestational sac identified. Endometrial complex measures 12 mm thick and appears heterogeneous, mildly hypervascular on color Doppler imaging. Small amount of endometrial fluid noted. No discrete mass/retained products are visualized. No free pelvic fluid. Ovaries unremarkable. No adnexal masses. IMPRESSION: No intrauterine gestation identified. In the absence of a visualized intrauterine pregnancy and with a positive pregnancy test, differential diagnosis includes nonviable pregnancy/abortion, early intrauterine pregnancy too early to visualize, and ectopic pregnancy. Serial quantitative beta HCG and or follow-up ultrasound recommended to definitively exclude ectopic pregnancy. No definite retained products of conception are identified though minimal fluid and heterogeneous and vascular endometrial complex is visualized; if patient has continued bleeding recommend follow-up imaging. Electronically Signed   By: Ulyses Southward M.D.   On: 01/10/2021 19:46   MDM UA, UPT CBC, HCG ABO/Rh- B Pos US OB Comp Less 14 weeks with Transvaginal  Discussed with patient options for management at this point including cytotec or D&C. Long discussion with patient about risks and benefits of both. Patient desires cytotec management.   Assessment and Plan   1. Retained products of conception after induced termination of pregnancy   2. Vaginal bleeding affecting early pregnancy    -Discharge home in stable condition -Rx for percocet,  phenergan, and ibuprofen sent to pharmacy -Vaginal bleeding and pain precautions discussed -Patient advised to follow-up with [facility] in 1 week for repeat bloodwork -Patient may return to MAU as needed or if her condition were to change or worsen    Rolm Bookbinder CNM 01/10/2021, 7:01 PM

## 2021-01-10 NOTE — ED Triage Notes (Signed)
Emergency Medicine Provider OB Triage Evaluation Note  Kylie Smith is a 25 y.o. female, G0P0000, at Unknown gestation who presents to the emergency department with complaints of heavy vaginal bleeding x1 week. Patient states gestation 8 weeks approximately.  Patient states she had an abortion 2 weeks ago at [facility].  She went to follow-up yesterday and had an ultrasound performed and was told by staff that " there was something still there."  She reports there was no doctor In the office so she could not receive official results. She also had positive pregnancy test yesterday.  She was told to receive a phone call today, she has not.  She is endorsing heavy vaginal bleeding.  She had a near syncopal episode this morning.  She states she is soaking through a pad an hour.  Review of  Systems  Positive: Vaginal bleeding, abdominal pain, near syncope, fatigue, nausea Negative: Fever, chills shortness of breath, emesis, pelvic pain  Physical Exam  BP 107/72 (BP Location: Right Arm)   Pulse 76   Temp 98.1 F (36.7 C) (Oral)   Resp 16   SpO2 100%  General: Awake, no distress  HEENT: Atraumatic  Resp: Normal effort  Cardiac: Normal rate Abd: Nondistended, nontender  MSK: Moves all extremities without difficulty Neuro: Speech clear  Medical Decision Making  Pt evaluated for pregnancy concern and is stable for transfer to MAU. Pt is in agreement with plan for transfer.  5:23 PM Discussed with MAU APP, Rayfield Citizen, who accepts patient in transfer. Concern for retained products of conception. Patient with stable vitals, no indication of sepsis based on vitals.  Clinical Impression  No diagnosis found.     Shanon Ace, PA-C 01/10/21 2225

## 2021-01-10 NOTE — ED Triage Notes (Signed)
Pt states she was 6-[redacted] weeks pregnant and had an abortion 2 weeks ago.  Had a + pregnancy test afterwards and states she had an [place] that "showed something still there" but Dr was not there to see [place].  Pt drank ETOH last night.  Had syncopal episode today, feels shaky, fatigued, abd pain, and heavy vaginal bleeding.

## 2021-01-10 NOTE — MAU Note (Signed)
.   Kylie Smith is a 25 y.o. here in MAU reporting: heavy vaginal bleeding that started this week. She states that she had an abortion on 12/27/20. Today she states that she is soaking through thick maxi pads. She also had a syncopal episode and has felt dizzy throughout the day.   Pain score: 7 Vitals:   01/10/21 1703 01/10/21 1813  BP: 107/72 131/69  Pulse: 76 (!) 104  Resp: 16 15  Temp: 98.1 F (36.7 C) 98.6 F (37 C)  SpO2: 100% 100%      Lab orders placed from triage: UA

## 2021-01-10 NOTE — ED Notes (Signed)
PA at triage to assess pt for MAU. 

## 2021-01-10 NOTE — ED Notes (Signed)
Report called to Holly in MAU. 

## 2021-01-16 ENCOUNTER — Ambulatory Visit: Payer: Federal, State, Local not specified - PPO | Admitting: Nurse Practitioner

## 2021-01-16 ENCOUNTER — Encounter: Payer: Self-pay | Admitting: Nurse Practitioner

## 2021-01-16 ENCOUNTER — Other Ambulatory Visit: Payer: Self-pay

## 2021-01-16 VITALS — BP 102/60 | HR 68 | Temp 97.9°F | Resp 16 | Wt 148.0 lb

## 2021-01-16 DIAGNOSIS — O034 Incomplete spontaneous abortion without complication: Secondary | ICD-10-CM

## 2021-01-16 LAB — WET PREP FOR TRICH, YEAST, CLUE

## 2021-01-16 NOTE — Patient Instructions (Signed)
We will be following your pregnancy hormone down to a negative reading. Some times this can take several weeks. Please start your iron supplement, one per day (and eat iron rich food such as meat, beans, spinach). Sometimes iron can make you constipated, so eat plenty of fiber and drink plenty of fluids.  You will be called with your lab results and we will develop your plan of care at that time.  Medication Abortion, Care After The following information offers guidance on how to care for yourself after your procedure. Your health care provider may also give you more specific instructions. If you have problems or questions, contact your health care provider. What can I expect after the procedure? After the procedure, it is common to have:  Bleeding that lasts for a few hours or a few days. It may feel like you are having a heavy menstrual period.  Cramps that are similar to menstrual cramps.  A headache.  Diarrhea.  Nausea and vomiting.  Fever, chills, or hot flushes.  Dizziness. Your next menstrual period will most likely start 4-6 weeks after the procedure, unless you start taking birth control pills. Follow these instructions at home: Medicines  Take over-the-counter and prescription medicines only as told by your health care provider.  Only take the medicines your health care provider recommends. Do not take aspirin. It can cause bleeding. Activity  Do not have sex for 2-3 weeks or until your health care provider approves.  Rest and avoid activity that requires a lot of effort for 2-3 weeks. General instructions  Do not douche or use tampons until your health care provider approves.  Ask your health care provider when you can start using hormonal birth control.  Keep all follow-up visits. This is important.   Contact a health care provider if:  You have a fever of 100.20F (38C) or higher.  You have pain that is not relieved by over-the-counter pain medicine.  You  have a bad-smelling vaginal discharge.  You have pain or bleeding that gets worse.  You have any of the following symptoms for more than 24 hours: ? Nausea. ? Vomiting. ? Diarrhea.  You need to change your pad more than once in an hour.  You do not have any bleeding after 3 days. This is a sign that the medicine may not be working. Get help right away if:  You have severe cramps in your stomach, back, or abdomen.  You become light-headed, weak, or faint. Summary  After the procedure, it is common to have bleeding, headache, diarrhea, nausea and vomiting, chills, and dizziness.  Take over-the-counter and prescription medicines only as told by your health care provider.  Do not have sex until your health care provider approves.  Keep all follow-up visits. This is important. This information is not intended to replace advice given to you by your health care provider. Make sure you discuss any questions you have with your health care provider. Document Revised: 09/18/2020 Document Reviewed: 09/18/2020 Elsevier Patient Education  2021 [employer].
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
## 2021-01-17 ENCOUNTER — Other Ambulatory Visit: Payer: Self-pay | Admitting: Nurse Practitioner

## 2021-01-17 DIAGNOSIS — O074 Failed attempted termination of pregnancy without complication: Secondary | ICD-10-CM

## 2021-01-17 LAB — HCG, QUANTITATIVE, PREGNANCY: HCG, Total, QN: 1286 m[IU]/mL

## 2021-01-19 ENCOUNTER — Ambulatory Visit: Payer: Federal, State, Local not specified - PPO | Admitting: Nurse Practitioner

## 2021-01-19 LAB — C. TRACHOMATIS/N. GONORRHOEAE RNA
C. trachomatis RNA, TMA: NOT DETECTED
N. gonorrhoeae RNA, TMA: NOT DETECTED

## 2021-01-23 ENCOUNTER — Other Ambulatory Visit: Payer: Self-pay

## 2021-01-23 ENCOUNTER — Other Ambulatory Visit: Payer: Federal, State, Local not specified - PPO

## 2021-01-29 ENCOUNTER — Other Ambulatory Visit: Payer: Federal, State, Local not specified - PPO

## 2021-01-29 ENCOUNTER — Telehealth: Payer: Self-pay

## 2021-01-29 NOTE — Telephone Encounter (Signed)
Patient called stating she forgot about her 10:30am lab appt for repeat Quant this morning and is going to have to r/s. I offered to make It later today but she said her day is full. She has time tomorrow and wants to r/s. I made her a lab appointment for 1:40pm tomorrow 01/30/21.  I stressed importance of rechecking it to be sure number is decreasing appropriately. She voiced understanding.

## 2021-01-30 ENCOUNTER — Other Ambulatory Visit: Payer: Self-pay

## 2021-01-30 ENCOUNTER — Other Ambulatory Visit (INDEPENDENT_AMBULATORY_CARE_PROVIDER_SITE_OTHER): Payer: Federal, State, Local not specified - PPO

## 2021-01-30 DIAGNOSIS — O034 Incomplete spontaneous abortion without complication: Secondary | ICD-10-CM

## 2021-01-30 DIAGNOSIS — O074 Failed attempted termination of pregnancy without complication: Secondary | ICD-10-CM

## 2021-01-31 LAB — HCG, QUANTITATIVE, PREGNANCY: HCG, Total, QN: 29 m[IU]/mL

## 2021-02-02 ENCOUNTER — Other Ambulatory Visit: Payer: Self-pay

## 2021-02-02 DIAGNOSIS — O034 Incomplete spontaneous abortion without complication: Secondary | ICD-10-CM

## 2021-02-05 ENCOUNTER — Other Ambulatory Visit: Payer: Self-pay

## 2021-02-11 ENCOUNTER — Telehealth: Payer: Self-pay

## 2021-02-11 NOTE — Telephone Encounter (Signed)
Kylie Crane, NP asked me to follow up with patient as she cancelled her last lab appt to have Quant repeated.  I called but her phone rang and rang. I will try again later this morning.

## 2021-02-11 NOTE — Telephone Encounter (Signed)
I called patient and spoke with her and told her that Clarita Crane, NP asked me to follow up with her because really important she makes sure that BHCG gets to negative.  Patient voiced understanding but said she was taking exam right now. SHe promised to call back in an hour to schedule a lab appt.

## 2021-02-12 NOTE — Telephone Encounter (Signed)
I called patient and left message that I was calling to schedule her lab appointment. I asked her to call the office back and speak to appointments and schedule lab appt asap.

## 2021-02-16 NOTE — Telephone Encounter (Signed)
Thank you for your multiple attempts. I think we can close this encounter. Hopefully the patient will call if she needs [place].
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
## 2021-03-02 ENCOUNTER — Ambulatory Visit: Payer: Federal, State, Local not specified - PPO | Admitting: Nurse Practitioner

## 2021-03-10 ENCOUNTER — Ambulatory Visit: Payer: Federal, State, Local not specified - PPO | Admitting: Nurse Practitioner

## 2021-06-30 ENCOUNTER — Ambulatory Visit: Payer: Federal, State, Local not specified - PPO | Admitting: Nurse Practitioner

## 2021-06-30 DIAGNOSIS — Z0289 Encounter for other administrative examinations: Secondary | ICD-10-CM

## 2021-07-09 ENCOUNTER — Ambulatory Visit: Payer: Federal, State, Local not specified - PPO | Admitting: Nurse Practitioner

## 2021-07-09 DIAGNOSIS — Z0289 Encounter for other administrative examinations: Secondary | ICD-10-CM

## 2021-07-13 ENCOUNTER — Ambulatory Visit: Payer: Federal, State, Local not specified - PPO | Admitting: Nurse Practitioner

## 2021-07-13 DIAGNOSIS — Z0289 Encounter for other administrative examinations: Secondary | ICD-10-CM

## 2021-08-04 ENCOUNTER — Ambulatory Visit (INDEPENDENT_AMBULATORY_CARE_PROVIDER_SITE_OTHER): Payer: Federal, State, Local not specified - PPO | Admitting: Nurse Practitioner

## 2021-08-04 ENCOUNTER — Other Ambulatory Visit: Payer: Self-pay

## 2021-08-04 ENCOUNTER — Encounter: Payer: Self-pay | Admitting: Nurse Practitioner

## 2021-08-04 VITALS — BP 114/70

## 2021-08-04 DIAGNOSIS — N760 Acute vaginitis: Secondary | ICD-10-CM

## 2021-08-04 DIAGNOSIS — Z113 Encounter for screening for infections with a predominantly sexual mode of transmission: Secondary | ICD-10-CM | POA: Diagnosis not present

## 2021-08-04 DIAGNOSIS — N898 Other specified noninflammatory disorders of vagina: Secondary | ICD-10-CM | POA: Diagnosis not present

## 2021-08-04 DIAGNOSIS — B9689 Other specified bacterial agents as the cause of diseases classified elsewhere: Secondary | ICD-10-CM

## 2021-08-04 LAB — WET PREP FOR TRICH, YEAST, CLUE

## 2021-08-04 MED ORDER — METRONIDAZOLE 500 MG PO TABS
500.0000 mg | ORAL_TABLET | Freq: Two times a day (BID) | ORAL | 0 refills | Status: DC
Start: 2021-08-04 — End: 2022-02-03

## 2021-08-04 NOTE — Progress Notes (Signed)
   Acute Office Visit  Subjective:    Patient ID: Kylie Smith, female    DOB: June 29, 1996, 25 y.o.   MRN: [id number]   HPI 25 y.o. presents today for vaginal irritation and discharge. Some months after her menses she experiences vaginal itching but this typically goes away after a few days. She would like STD screening.    Review of Systems  Constitutional: Negative.   Genitourinary:  Positive for vaginal discharge and vaginal pain.      Objective:    Physical Exam Constitutional:      Appearance: Normal appearance.  Genitourinary:    General: Normal vulva.     Vagina: Vaginal discharge present. No erythema.     Cervix: Normal.    BP 114/70   LMP 07/30/2021  Wt Readings from Last 3 Encounters:  01/16/21 148 lb (67.1 kg)  01/10/21 150 lb (68 kg)  08/07/19 147 lb (66.7 kg)   Wet prep + clue cells     Assessment & Plan:   Problem List Items Addressed This Visit   None Visit Diagnoses     Bacterial vaginosis    -  Primary   Relevant Medications   metroNIDAZOLE (FLAGYL) 500 MG tablet   Screen for STD (sexually transmitted disease)       Relevant Orders   SURESWAB CT/NG/T. vaginalis   HIV Antibody (routine testing w rflx)   RPR   Vaginal irritation       Relevant Orders   WET PREP FOR TRICH, YEAST, CLUE      Plan: Wet prep positive for clue cells - Flagyl 500 mg BID x 7 days. STD panel pending.      Olivia Mackie DNP, 11:49 AM 08/04/2021

## 2021-08-05 ENCOUNTER — Other Ambulatory Visit: Payer: Self-pay | Admitting: Nurse Practitioner

## 2021-08-05 DIAGNOSIS — A749 Chlamydial infection, unspecified: Secondary | ICD-10-CM

## 2021-08-05 LAB — RPR: RPR Ser Ql: NONREACTIVE

## 2021-08-05 LAB — SURESWAB CT/NG/T. VAGINALIS
C. trachomatis RNA, TMA: DETECTED — AB
N. gonorrhoeae RNA, TMA: NOT DETECTED
Trichomonas vaginalis RNA: NOT DETECTED

## 2021-08-05 LAB — HIV ANTIBODY (ROUTINE TESTING W REFLEX): HIV 1&2 Ab, 4th Generation: NONREACTIVE

## 2021-08-05 MED ORDER — DOXYCYCLINE MONOHYDRATE 100 MG PO CAPS: 100.0000 mg | ORAL_CAPSULE | Freq: Two times a day (BID) | ORAL | 0 refills | Status: AC

## 2021-12-18 ENCOUNTER — Telehealth: Payer: Self-pay

## 2021-12-18 NOTE — Telephone Encounter (Signed)
She can try over the counter monistat. If she isn't feeling better, she should be seen.

## 2021-12-18 NOTE — Telephone Encounter (Signed)
Patient calling to request Diflucan for yeast like symptoms. Pt reports excessive itching and d/c. Denies odor. Reports doing a lot of walking lately and possibly not wearing the correct type of underwear. Also reports expecting to start cycle in a week. Please advise.

## 2021-12-18 NOTE — Telephone Encounter (Signed)
Pt notified and voiced understanding. Desires to make an appt. Msg sent to scheduling.

## 2021-12-21 NOTE — Telephone Encounter (Signed)
Appt fo 1/11 @ 9

## 2021-12-23 ENCOUNTER — Ambulatory Visit: Payer: Federal, State, Local not specified - PPO | Admitting: Nurse Practitioner

## 2021-12-23 DIAGNOSIS — Z0289 Encounter for other administrative examinations: Secondary | ICD-10-CM

## 2022-02-03 ENCOUNTER — Encounter: Payer: Self-pay | Admitting: Radiology

## 2022-02-03 ENCOUNTER — Ambulatory Visit: Payer: Federal, State, Local not specified - PPO | Admitting: Nurse Practitioner

## 2022-02-03 ENCOUNTER — Other Ambulatory Visit: Payer: Self-pay

## 2022-02-03 ENCOUNTER — Ambulatory Visit: Payer: Federal, State, Local not specified - PPO | Admitting: Radiology

## 2022-02-03 VITALS — BP 118/74

## 2022-02-03 DIAGNOSIS — Z30011 Encounter for initial prescription of contraceptive pills: Secondary | ICD-10-CM

## 2022-02-03 DIAGNOSIS — A64 Unspecified sexually transmitted disease: Secondary | ICD-10-CM | POA: Diagnosis not present

## 2022-02-03 MED ORDER — NORETHIN ACE-ETH ESTRAD-FE 1-20 MG-MCG PO TABS: 1.0000 | ORAL_TABLET | Freq: Every day | ORAL | 1 refills | Status: DC

## 2022-02-03 NOTE — Progress Notes (Signed)
° ° ° °  Subjective: 25yo here for STI screen. Has unprotected intercourse 3 weeks ago. Has been using condoms since. Would also like to restart OCPs  -LMP 01/23/22 - Sexually active with 1 female partner(s) - Last sexual encounter: 3 days ago - Contraception: condoms Symptoms: - Abnormal vaginal discharge: yes - Missed period: no - Fever: no - Abdominal/Pelvic pain: no - Vaginal bleeding: no - Pain during sex: no - Rash: no  Objective:  -Vulva: without lesions or discharge -Vagina: discharge present, wet prep obtained -Cervix: no lesion or discharge, no CMT -Perineum: no lesions -Uterus: Mobile, non tender -Adnexa: no masses or tenderness   Chaperone offered and declined.  Assessment/Plan:  -STI screen  Gc/ct cultures  STD serology -OCP start  Junel fe 1/20 sent to pharmacy, may begin sunday  Will contact patient with results of testing completed today.  Safe sex encouraged. Avoid the use of soaps or perfumed products in the peri area. Warning signs reviewed for use of OCPs.  F/u 3 mos AEX

## 2022-02-04 LAB — C. TRACHOMATIS/N. GONORRHOEAE RNA
C. trachomatis RNA, TMA: NOT DETECTED
N. gonorrhoeae RNA, TMA: NOT DETECTED

## 2022-02-04 LAB — RPR: RPR Ser Ql: NONREACTIVE

## 2022-02-04 LAB — HEPATITIS C ANTIBODY
Hepatitis C Ab: NONREACTIVE
SIGNAL TO CUT-OFF: 0.03 (ref <1.00)

## 2022-02-04 LAB — HIV ANTIBODY (ROUTINE TESTING W REFLEX): HIV 1&2 Ab, 4th Generation: NONREACTIVE

## 2022-05-05 ENCOUNTER — Ambulatory Visit (INDEPENDENT_AMBULATORY_CARE_PROVIDER_SITE_OTHER): Payer: Federal, State, Local not specified - PPO | Admitting: Nurse Practitioner

## 2022-05-05 ENCOUNTER — Encounter: Payer: Self-pay | Admitting: Nurse Practitioner

## 2022-05-05 VITALS — BP 114/70

## 2022-05-05 DIAGNOSIS — N760 Acute vaginitis: Secondary | ICD-10-CM

## 2022-05-05 DIAGNOSIS — Z113 Encounter for screening for infections with a predominantly sexual mode of transmission: Secondary | ICD-10-CM | POA: Diagnosis not present

## 2022-05-05 DIAGNOSIS — A599 Trichomoniasis, unspecified: Secondary | ICD-10-CM

## 2022-05-05 DIAGNOSIS — B9689 Other specified bacterial agents as the cause of diseases classified elsewhere: Secondary | ICD-10-CM

## 2022-05-05 DIAGNOSIS — N898 Other specified noninflammatory disorders of vagina: Secondary | ICD-10-CM | POA: Diagnosis not present

## 2022-05-05 LAB — WET PREP FOR TRICH, YEAST, CLUE

## 2022-05-05 MED ORDER — BORIC ACID VAGINAL 600 MG VA SUPP: 1.0000 | VAGINAL | 1 refills | Status: AC

## 2022-05-05 MED ORDER — METRONIDAZOLE 500 MG PO TABS: 500.0000 mg | ORAL_TABLET | Freq: Two times a day (BID) | ORAL | 0 refills | Status: DC

## 2022-05-05 NOTE — Progress Notes (Signed)
   Acute Office Visit  Subjective:    Patient ID: Kylie Smith, female    DOB: [birth year], 26 y.o.   MRN: [id number]   HPI 26 y.o. presents today for vaginal discharge, odor, and vulvar irritation. She feels she gets BV after menses. It typically does resolve. She has not been using her normal non-fragranted body wash due to travel. Would like STD screening today, declines HIV/RPR.    Review of Systems  Constitutional: Negative.   Genitourinary:  Positive for vaginal discharge.       Vulvar irritation, vaginal odor      Objective:    Physical Exam Constitutional:      Appearance: Normal appearance.  Genitourinary:    General: Normal vulva.     Vagina: Vaginal discharge and erythema present.     Cervix: Normal.    BP 114/70   LMP 04/26/2022  Wt Readings from Last 3 Encounters:  01/16/21 148 lb (67.1 kg)  01/10/21 150 lb (68 kg)  08/07/19 147 lb (66.7 kg)        Patient informed chaperone available to be present for pelvic exam. Patient has requested no chaperone to be present. Patient has been advised what will be completed during breast and pelvic exam.   Wet prep + trich, + clue cells  Assessment & Plan:   Problem List Items Addressed This Visit   None Visit Diagnoses     Trichomonas infection    -  Primary   Relevant Medications   metroNIDAZOLE (FLAGYL) 500 MG tablet   Vaginal discharge       Relevant Orders   WET PREP FOR [place], YEAST, CLUE   Screening examination for STD (sexually transmitted disease)       Relevant Orders   C. trachomatis/N. gonorrhoeae RNA   Bacterial vaginosis       Relevant Medications   metroNIDAZOLE (FLAGYL) 500 MG tablet   Boric Acid Vaginal 600 MG SUPP (Start on 05/06/2022)      Plan: Wet prep positive for trich and clue cells - Flagyl 500 mg BID x 7 days. Inform partner/s, no intercourse until 7 days after treatment, return in 1 month for TOC. Begin boric acid suppositories twice weekly after treating current infection.  GC/chlamydia pending.      Tamela Gammon DNP, 10:35 AM 05/05/2022

## 2022-05-06 LAB — C. TRACHOMATIS/N. GONORRHOEAE RNA
C. trachomatis RNA, TMA: NOT DETECTED
N. gonorrhoeae RNA, TMA: NOT DETECTED

## 2022-05-18 ENCOUNTER — Other Ambulatory Visit: Payer: Self-pay | Admitting: Nurse Practitioner

## 2022-05-18 DIAGNOSIS — A599 Trichomoniasis, unspecified: Secondary | ICD-10-CM

## 2022-05-18 DIAGNOSIS — N760 Acute vaginitis: Secondary | ICD-10-CM

## 2022-05-18 MED ORDER — METRONIDAZOLE 500 MG PO TABS: 500.0000 mg | ORAL_TABLET | Freq: Two times a day (BID) | ORAL | 0 refills | Status: DC

## 2022-05-18 NOTE — Telephone Encounter (Signed)
Note attached to Rx from patient "Lost my prescription and need a refill "

## 2022-05-20 ENCOUNTER — Telehealth: Payer: Self-pay | Admitting: *Deleted

## 2022-05-20 NOTE — Telephone Encounter (Signed)
[name] please see below.

## 2022-05-20 NOTE — Telephone Encounter (Signed)
Left detailed message on cell per DPR access. 

## 2022-05-20 NOTE — Telephone Encounter (Signed)
No availability this afternoon. If it is severe I recommend she go to the ER for evaluation.

## 2022-05-20 NOTE — Telephone Encounter (Signed)
-----   Message from Lillia Carmel, [place] sent at 05/20/2022 12:43 PM EDT ----- Patient is calling about severe pelvic pain when standing. I gave her tomorrow at 2 with TW that was the only opening. Is this okay or does TW want to fit her in this afternoon ?

## 2022-05-21 ENCOUNTER — Encounter: Payer: Self-pay | Admitting: Nurse Practitioner

## 2022-05-21 ENCOUNTER — Ambulatory Visit: Payer: Federal, State, Local not specified - PPO | Admitting: Nurse Practitioner

## 2022-05-21 VITALS — BP 116/74

## 2022-05-21 DIAGNOSIS — N739 Female pelvic inflammatory disease, unspecified: Secondary | ICD-10-CM

## 2022-05-21 DIAGNOSIS — A599 Trichomoniasis, unspecified: Secondary | ICD-10-CM

## 2022-05-21 MED ORDER — DOXYCYCLINE MONOHYDRATE 100 MG PO CAPS: 100.0000 mg | ORAL_CAPSULE | Freq: Two times a day (BID) | ORAL | 0 refills | Status: DC

## 2022-05-21 NOTE — Progress Notes (Signed)
   Acute Office Visit  Subjective:    Patient ID: Kylie Smith, female    DOB: 1996/03/04, 26 y.o.   MRN: [id number]   HPI 26 y.o. presents today for pelvic pain. She woke up yesterday to severe lower abdominal pain that was sharp and remained severe for 1-2 hours. Pain has improved but she feels very tender to lower abdomen. Treated for trichomonas 05/05/2022, lost prescription halfway through and restarted 7-day course two days ago. Denies any urinary or GI symptoms. But she did feel deep internal pain with defecation and urination. Denies fever or chills.    Review of Systems  Constitutional: Negative.   Gastrointestinal:  Positive for abdominal pain. Negative for blood in stool, constipation, diarrhea, nausea, rectal pain and vomiting.  Genitourinary:  Positive for pelvic pain. Negative for difficulty urinating, dysuria, flank pain, frequency, hematuria, urgency, vaginal bleeding and vaginal discharge.       Objective:    Physical Exam Constitutional:      Appearance: Normal appearance.  Genitourinary:    General: Normal vulva.     Vagina: Normal.     Cervix: Cervical motion tenderness present. No discharge, friability, lesion, erythema or cervical bleeding.     Uterus: Tender. Not enlarged.      BP 116/74   LMP 04/26/2022  Wt Readings from Last 3 Encounters:  01/16/21 148 lb (67.1 kg)  01/10/21 150 lb (68 kg)  08/07/19 147 lb (66.7 kg)        Patient informed chaperone available to be present for breast and/or pelvic exam. Patient has requested no chaperone to be present. Patient has been advised what will be completed during breast and pelvic exam.   Assessment & Plan:   Problem List Items Addressed This Visit   None Visit Diagnoses     Pelvic inflammatory disease    -  Primary   Relevant Medications   doxycycline (MONODOX) 100 MG capsule   Trichomonas infection          Plan: Will treat for PID - Doxycycline 100 mg BID x 14 days. Complete full course of  Flagyl. Will follow up if symptoms worsen or do not improve. She is agreeable to plan.      Olivia Mackie DNP, 2:25 PM 05/21/2022

## 2022-06-04 ENCOUNTER — Other Ambulatory Visit: Payer: Self-pay

## 2022-06-04 ENCOUNTER — Other Ambulatory Visit (HOSPITAL_COMMUNITY)
Admission: EM | Admit: 2022-06-04 | Discharge: 2022-06-06 | Disposition: A | Discharge status: Home / Self Care | Payer: Federal, State, Local not specified - PPO | Attending: Psychiatry | Admitting: Psychiatry

## 2022-06-04 DIAGNOSIS — F332 Major depressive disorder, recurrent severe without psychotic features: Secondary | ICD-10-CM

## 2022-06-04 DIAGNOSIS — F3181 Bipolar II disorder: Secondary | ICD-10-CM | POA: Diagnosis not present

## 2022-06-04 DIAGNOSIS — Z79899 Other long term (current) drug therapy: Secondary | ICD-10-CM | POA: Insufficient documentation

## 2022-06-04 DIAGNOSIS — R45851 Suicidal ideations: Secondary | ICD-10-CM | POA: Diagnosis not present

## 2022-06-04 DIAGNOSIS — Z20822 Contact with and (suspected) exposure to covid-19: Secondary | ICD-10-CM | POA: Insufficient documentation

## 2022-06-04 LAB — RESP PANEL BY RT-PCR (FLU A&B, COVID) ARPGX2
Influenza A by PCR: NEGATIVE
Influenza B by PCR: NEGATIVE
SARS Coronavirus 2 by RT PCR: NEGATIVE

## 2022-06-04 LAB — CBC WITH DIFFERENTIAL/PLATELET
Abs Immature Granulocytes: 0.01 10*3/uL (ref 0.00–0.07)
Basophils Absolute: 0 10*3/uL (ref 0.0–0.1)
Basophils Relative: 0 %
Eosinophils Absolute: 0.1 10*3/uL (ref 0.0–0.5)
Eosinophils Relative: 2 %
HCT: 40.9 % (ref 36.0–46.0)
Hemoglobin: 13.3 g/dL (ref 12.0–15.0)
Immature Granulocytes: 0 %
Lymphocytes Relative: 41 %
Lymphs Abs: 1.9 10*3/uL (ref 0.7–4.0)
MCH: 27.6 pg (ref 26.0–34.0)
MCHC: 32.5 g/dL (ref 30.0–36.0)
MCV: 84.9 fL (ref 80.0–100.0)
Monocytes Absolute: 0.2 10*3/uL (ref 0.1–1.0)
Monocytes Relative: 5 %
Neutro Abs: 2.4 10*3/uL (ref 1.7–7.7)
Neutrophils Relative %: 52 %
Platelets: 335 10*3/uL (ref 150–400)
RBC: 4.82 MIL/uL (ref 3.87–5.11)
RDW: 14.3 % (ref 11.5–15.5)
WBC: 4.6 10*3/uL (ref 4.0–10.5)
nRBC: 0 % (ref 0.0–0.2)

## 2022-06-04 LAB — COMPREHENSIVE METABOLIC PANEL
ALT: 17 U/L (ref 0–44)
AST: 24 U/L (ref 15–41)
Albumin: 4.5 g/dL (ref 3.5–5.0)
Alkaline Phosphatase: 58 U/L (ref 38–126)
Anion gap: 7 (ref 5–15)
BUN: 5 mg/dL — ABNORMAL LOW (ref 6–20)
CO2: 28 mmol/L (ref 22–32)
Calcium: 8.9 mg/dL (ref 8.9–10.3)
Chloride: 109 mmol/L (ref 98–111)
Creatinine, Ser: 0.68 mg/dL (ref 0.44–1.00)
GFR, Estimated: >60 mL/min (ref >60)
Glucose, Bld: 97 mg/dL (ref 70–99)
Potassium: 4 mmol/L (ref 3.5–5.1)
Sodium: 144 mmol/L (ref 135–145)
Total Bilirubin: 0.4 mg/dL (ref 0.3–1.2)
Total Protein: 7.5 g/dL (ref 6.5–8.1)

## 2022-06-04 LAB — POCT URINE DRUG SCREEN - MANUAL ENTRY (I-SCREEN)
POC Amphetamine UR: NOT DETECTED
POC Buprenorphine (BUP): NOT DETECTED
POC Cocaine UR: POSITIVE — AB
POC Marijuana UR: NOT DETECTED
POC Methadone UR: NOT DETECTED
POC Methamphetamine UR: NOT DETECTED
POC Morphine: NOT DETECTED
POC Oxazepam (BZO): NOT DETECTED
POC Oxycodone UR: NOT DETECTED
POC Secobarbital (BAR): NOT DETECTED

## 2022-06-04 LAB — POC SARS CORONAVIRUS 2 AG -  ED: SARS Coronavirus 2 Ag: NEGATIVE

## 2022-06-04 LAB — TSH: TSH: 2.029 u[IU]/mL (ref 0.350–4.500)

## 2022-06-04 LAB — POCT PREGNANCY, URINE: Preg Test, Ur: NEGATIVE

## 2022-06-04 LAB — LIPID PANEL
Cholesterol: 172 mg/dL (ref 0–200)
HDL: 72 mg/dL (ref >40)
LDL Cholesterol: 88 mg/dL (ref 0–99)
Total CHOL/HDL Ratio: 2.4 RATIO
Triglycerides: 60 mg/dL (ref <150)
VLDL: 12 mg/dL (ref 0–40)

## 2022-06-04 LAB — HEMOGLOBIN A1C
Hgb A1c MFr Bld: 4.9 % (ref 4.8–5.6)
Mean Plasma Glucose: 93.93 mg/dL

## 2022-06-04 LAB — ETHANOL: Alcohol, Ethyl (B): 182 mg/dL — ABNORMAL HIGH (ref <10)

## 2022-06-04 LAB — POC SARS CORONAVIRUS 2 AG: SARSCOV2ONAVIRUS 2 AG: NEGATIVE

## 2022-06-04 MED ORDER — HYDROXYZINE HCL 25 MG PO TABS
25.0000 mg | ORAL_TABLET | Freq: Three times a day (TID) | ORAL | Status: DC | PRN
  Administered 2022-06-04: 25 mg via ORAL
  Filled 2022-06-04: qty 1

## 2022-06-04 MED ORDER — TRAZODONE HCL 50 MG PO TABS
50.0000 mg | ORAL_TABLET | Freq: Every evening | ORAL | Status: DC | PRN
  Administered 2022-06-04: 50 mg via ORAL
  Filled 2022-06-04: qty 1

## 2022-06-04 MED ORDER — ACETAMINOPHEN 325 MG PO TABS: 650.0000 mg | ORAL_TABLET | Freq: Four times a day (QID) | ORAL | Status: DC | PRN

## 2022-06-04 MED ORDER — MAGNESIUM HYDROXIDE 400 MG/5ML PO SUSP: 30.0000 mL | Freq: Every day | ORAL | Status: DC | PRN

## 2022-06-04 MED ORDER — ALUM & MAG HYDROXIDE-SIMETH 200-200-20 MG/5ML PO SUSP: 30.0000 mL | ORAL | Status: DC | PRN

## 2022-06-04 NOTE — ED Notes (Signed)
Patient admitted to [facility] from [facility] due to depression and anxiety.  Patient reports not being able to "handle " her emotions lately and has been increasingly depressed over the last several weeks culminating in a break up with her boyfriend earlier in the week.  Patient appears sad with dysphoric mood and constricted affect.  She was cooperative with admission and oriented to unit and show to her room.  Patient given linens, toiletry supplies and encouraged to seek out staff if overwhelmed by thoughts or feelings.  She denies avh shi or plan.  Will monitor, meet needs as they arise and provide a safe environment.

## 2022-06-05 LAB — GLUCOSE, CAPILLARY: Glucose-Capillary: 107 mg/dL — ABNORMAL HIGH (ref 70–99)

## 2022-06-05 MED ORDER — CHLORDIAZEPOXIDE HCL 25 MG PO CAPS: 25.0000 mg | ORAL_CAPSULE | Freq: Four times a day (QID) | ORAL | Status: DC | PRN

## 2022-06-05 MED ORDER — LOPERAMIDE HCL 2 MG PO CAPS: 2.0000 mg | ORAL_CAPSULE | ORAL | Status: DC | PRN

## 2022-06-05 MED ORDER — THIAMINE HCL 100 MG/ML IJ SOLN
100.0000 mg | Freq: Once | INTRAMUSCULAR | Status: AC
  Administered 2022-06-05: 100 mg via INTRAMUSCULAR
  Filled 2022-06-05: qty 2

## 2022-06-05 MED ORDER — ADULT MULTIVITAMIN W/MINERALS CH
1.0000 | ORAL_TABLET | Freq: Every day | ORAL | Status: DC
  Administered 2022-06-05 – 2022-06-06 (×2): 1 via ORAL
  Filled 2022-06-05 (×2): qty 1

## 2022-06-05 MED ORDER — ONDANSETRON 4 MG PO TBDP: 4.0000 mg | ORAL_TABLET | Freq: Four times a day (QID) | ORAL | Status: DC | PRN

## 2022-06-05 MED ORDER — THIAMINE HCL 100 MG PO TABS
100.0000 mg | ORAL_TABLET | Freq: Every day | ORAL | Status: DC
  Administered 2022-06-06: 100 mg via ORAL
  Filled 2022-06-05: qty 1

## 2022-06-05 MED ORDER — ARIPIPRAZOLE 5 MG PO TABS
5.0000 mg | ORAL_TABLET | Freq: Every day | ORAL | Status: DC
  Administered 2022-06-05 – 2022-06-06 (×2): 5 mg via ORAL
  Filled 2022-06-05 (×2): qty 1

## 2022-06-05 NOTE — ED Notes (Signed)
Pt did not get up for group

## 2022-06-05 NOTE — ED Notes (Signed)
Pt asleep in bed. Respirations even and unlabored. Will continue to monitor for safety. ?

## 2022-06-05 NOTE — ED Notes (Addendum)
Pt was notified that lunch is ready 

## 2022-06-05 NOTE — Progress Notes (Addendum)
Kylie Smith got up to eat dinner and received her medications from this AM. She stated feeling better since she slept throughout the day.She endorsed feeling anxious but refused medication. She denied feeling depressed and suicidal. She laid down on two chairs in the dinning room and stated she feels hot and light headed like she is going to pass out. VS and BS was checked. BS 107.

## 2022-06-06 MED ORDER — ARIPIPRAZOLE 5 MG PO TABS: 5.0000 mg | ORAL_TABLET | Freq: Every day | ORAL | 0 refills | Status: AC

## 2022-06-08 ENCOUNTER — Ambulatory Visit: Payer: Self-pay | Admitting: Nurse Practitioner

## 2022-06-08 ENCOUNTER — Ambulatory Visit: Payer: Federal, State, Local not specified - PPO | Admitting: Nurse Practitioner

## 2022-06-08 DIAGNOSIS — Z0289 Encounter for other administrative examinations: Secondary | ICD-10-CM

## 2022-06-08 NOTE — Progress Notes (Deleted)
   Kylie Smith Nov 20, 1996 [id number]   History:  26 y.o. G1P0010 presents for annual exam. Monthly cycles. Normal pap history. Treated for possible PID 05/21/2022 following trichomonas infection. She lost prescription for Flagyl, so treatment was delayed for STI. She did complete course of Flagyl. Recently voluntarily committed for suicidal ideations 06/04/2022, discharge 2 days ago. H/O Bipolar 2 disorder and delayed processing disorder.   Gynecologic History No LMP recorded.   Contraception/Family planning: {method:5051} Sexually active: ***  Health Maintenance Last Pap: 08/07/2019. Results were: Normal Last mammogram: Not indicated Last colonoscopy: Not indicated Last Dexa: Not indicated   Past medical history, past surgical history, family history and social history were all reviewed and documented in the EPIC chart.  ROS:  A ROS was performed and pertinent positives and negatives are included.  Exam:  There were no vitals filed for this visit. There is no height or weight on file to calculate BMI.  General appearance:  Normal Thyroid:  Symmetrical, normal in size, without palpable masses or nodularity. Respiratory  Auscultation:  Clear without wheezing or rhonchi Cardiovascular  Auscultation:  Regular rate, without rubs, murmurs or gallops  Edema/varicosities:  Not grossly evident Abdominal  Soft,nontender, without masses, guarding or rebound.  Liver/spleen:  No organomegaly noted  Hernia:  None appreciated  Skin  Inspection:  Grossly normal Breasts: Examined lying and sitting.   Right: Without masses, retractions, nipple discharge or axillary adenopathy.   Left: Without masses, retractions, nipple discharge or axillary adenopathy. Genitourinary   Inguinal/mons:  Normal without inguinal adenopathy  External genitalia:  Normal appearing vulva with no masses, tenderness, or lesions  BUS/Urethra/Skene's glands:  Normal  Vagina:  Normal appearing with normal color and  discharge, no lesions  Cervix:  Normal appearing without discharge or lesions  Uterus:  Normal in size, shape and contour.  Midline and mobile, nontender  Adnexa/parametria:     Rt: Normal in size, without masses or tenderness.   Lt: Normal in size, without masses or tenderness.  Anus and perineum: Normal  Digital rectal exam: Not indicated  Patient informed chaperone available to be present for breast and pelvic exam. Patient has requested no chaperone to be present. Patient has been advised what will be completed during breast and pelvic exam.   Assessment/Plan:  26 y.o. G1P0010 for annual exam.    Return in 1 year for annual.   Olivia Mackie DNP, 10:32 AM 06/08/2022

## 2022-06-24 ENCOUNTER — Ambulatory Visit: Payer: Federal, State, Local not specified - PPO | Admitting: Nurse Practitioner
